# Patient Record
Sex: Male | Born: 1960 | Race: White | Hispanic: No | Smoking: Current every day smoker
Health system: Southern US, Community
[De-identification: ages and names within clinical notes are randomized; demographics above are authoritative.]

## PROBLEM LIST (undated history)

## (undated) DIAGNOSIS — F1011 Alcohol abuse, in remission: Secondary | ICD-10-CM

## (undated) HISTORY — PX: HUMERUS FRACTURE SURGERY: SHX670

---

## 2012-04-12 HISTORY — PX: ANKLE SURGERY: SHX546

## 2012-10-09 HISTORY — PX: ANKLE ARTHROPLASTY: SUR68

## 2017-07-10 ENCOUNTER — Encounter (HOSPITAL_COMMUNITY): Payer: Self-pay | Admitting: Radiology

## 2017-07-10 ENCOUNTER — Inpatient Hospital Stay (HOSPITAL_COMMUNITY): Payer: BLUE CROSS/BLUE SHIELD

## 2017-07-10 ENCOUNTER — Emergency Department (HOSPITAL_COMMUNITY): Payer: BLUE CROSS/BLUE SHIELD

## 2017-07-10 ENCOUNTER — Other Ambulatory Visit: Payer: Self-pay

## 2017-07-10 ENCOUNTER — Inpatient Hospital Stay (HOSPITAL_COMMUNITY)
Admission: EM | Admit: 2017-07-10 | Discharge: 2017-07-15 | DRG: 481 | Disposition: A | Discharge status: Rehabilitation Facility | Payer: BLUE CROSS/BLUE SHIELD | Attending: General Surgery | Admitting: General Surgery

## 2017-07-10 DIAGNOSIS — Z23 Encounter for immunization: Secondary | ICD-10-CM | POA: Diagnosis not present

## 2017-07-10 DIAGNOSIS — J939 Pneumothorax, unspecified: Secondary | ICD-10-CM | POA: Diagnosis not present

## 2017-07-10 DIAGNOSIS — S7221XA Displaced subtrochanteric fracture of right femur, initial encounter for closed fracture: Secondary | ICD-10-CM | POA: Diagnosis present

## 2017-07-10 DIAGNOSIS — S060X9A Concussion with loss of consciousness of unspecified duration, initial encounter: Secondary | ICD-10-CM | POA: Diagnosis present

## 2017-07-10 DIAGNOSIS — Z803 Family history of malignant neoplasm of breast: Secondary | ICD-10-CM | POA: Diagnosis not present

## 2017-07-10 DIAGNOSIS — R402412 Glasgow coma scale score 13-15, at arrival to emergency department: Secondary | ICD-10-CM | POA: Diagnosis present

## 2017-07-10 DIAGNOSIS — Z801 Family history of malignant neoplasm of trachea, bronchus and lung: Secondary | ICD-10-CM

## 2017-07-10 DIAGNOSIS — R609 Edema, unspecified: Secondary | ICD-10-CM | POA: Diagnosis not present

## 2017-07-10 DIAGNOSIS — S7221XD Displaced subtrochanteric fracture of right femur, subsequent encounter for closed fracture with routine healing: Secondary | ICD-10-CM | POA: Diagnosis not present

## 2017-07-10 DIAGNOSIS — Z8249 Family history of ischemic heart disease and other diseases of the circulatory system: Secondary | ICD-10-CM

## 2017-07-10 DIAGNOSIS — R739 Hyperglycemia, unspecified: Secondary | ICD-10-CM | POA: Diagnosis present

## 2017-07-10 DIAGNOSIS — D62 Acute posthemorrhagic anemia: Secondary | ICD-10-CM | POA: Diagnosis not present

## 2017-07-10 DIAGNOSIS — R0902 Hypoxemia: Secondary | ICD-10-CM | POA: Diagnosis not present

## 2017-07-10 DIAGNOSIS — S7290XA Unspecified fracture of unspecified femur, initial encounter for closed fracture: Secondary | ICD-10-CM

## 2017-07-10 DIAGNOSIS — S2221XA Fracture of manubrium, initial encounter for closed fracture: Secondary | ICD-10-CM | POA: Diagnosis present

## 2017-07-10 DIAGNOSIS — S20319A Abrasion of unspecified front wall of thorax, initial encounter: Secondary | ICD-10-CM | POA: Diagnosis present

## 2017-07-10 DIAGNOSIS — E669 Obesity, unspecified: Secondary | ICD-10-CM | POA: Diagnosis present

## 2017-07-10 DIAGNOSIS — Z8042 Family history of malignant neoplasm of prostate: Secondary | ICD-10-CM

## 2017-07-10 DIAGNOSIS — Y92410 Unspecified street and highway as the place of occurrence of the external cause: Secondary | ICD-10-CM

## 2017-07-10 DIAGNOSIS — Z419 Encounter for procedure for purposes other than remedying health state, unspecified: Secondary | ICD-10-CM

## 2017-07-10 DIAGNOSIS — S2241XA Multiple fractures of ribs, right side, initial encounter for closed fracture: Secondary | ICD-10-CM | POA: Diagnosis present

## 2017-07-10 DIAGNOSIS — M79641 Pain in right hand: Secondary | ICD-10-CM | POA: Diagnosis present

## 2017-07-10 DIAGNOSIS — S2241XS Multiple fractures of ribs, right side, sequela: Secondary | ICD-10-CM | POA: Diagnosis not present

## 2017-07-10 DIAGNOSIS — T07XXXA Unspecified multiple injuries, initial encounter: Secondary | ICD-10-CM | POA: Diagnosis not present

## 2017-07-10 DIAGNOSIS — S12500A Unspecified displaced fracture of sixth cervical vertebra, initial encounter for closed fracture: Secondary | ICD-10-CM | POA: Diagnosis present

## 2017-07-10 DIAGNOSIS — K5900 Constipation, unspecified: Secondary | ICD-10-CM | POA: Diagnosis not present

## 2017-07-10 DIAGNOSIS — F17210 Nicotine dependence, cigarettes, uncomplicated: Secondary | ICD-10-CM | POA: Diagnosis present

## 2017-07-10 DIAGNOSIS — S2249XA Multiple fractures of ribs, unspecified side, initial encounter for closed fracture: Secondary | ICD-10-CM

## 2017-07-10 DIAGNOSIS — M25561 Pain in right knee: Secondary | ICD-10-CM | POA: Diagnosis present

## 2017-07-10 DIAGNOSIS — T1490XA Injury, unspecified, initial encounter: Secondary | ICD-10-CM | POA: Diagnosis not present

## 2017-07-10 DIAGNOSIS — F418 Other specified anxiety disorders: Secondary | ICD-10-CM | POA: Diagnosis not present

## 2017-07-10 DIAGNOSIS — S2241XD Multiple fractures of ribs, right side, subsequent encounter for fracture with routine healing: Secondary | ICD-10-CM | POA: Diagnosis not present

## 2017-07-10 DIAGNOSIS — S7221XS Displaced subtrochanteric fracture of right femur, sequela: Secondary | ICD-10-CM | POA: Diagnosis not present

## 2017-07-10 DIAGNOSIS — Z6827 Body mass index (BMI) 27.0-27.9, adult: Secondary | ICD-10-CM | POA: Diagnosis not present

## 2017-07-10 DIAGNOSIS — S7291XA Unspecified fracture of right femur, initial encounter for closed fracture: Secondary | ICD-10-CM

## 2017-07-10 DIAGNOSIS — K5901 Slow transit constipation: Secondary | ICD-10-CM | POA: Diagnosis not present

## 2017-07-10 DIAGNOSIS — R339 Retention of urine, unspecified: Secondary | ICD-10-CM | POA: Diagnosis not present

## 2017-07-10 HISTORY — DX: Alcohol abuse, in remission: F10.11

## 2017-07-10 LAB — COMPREHENSIVE METABOLIC PANEL
ALBUMIN: 3.5 g/dL (ref 3.5–5.0)
ALT: 30 U/L (ref 17–63)
ANION GAP: 10 (ref 5–15)
AST: 45 U/L — ABNORMAL HIGH (ref 15–41)
Alkaline Phosphatase: 57 U/L (ref 38–126)
BUN: 15 mg/dL (ref 6–20)
CALCIUM: 8.5 mg/dL — AB (ref 8.9–10.3)
CHLORIDE: 106 mmol/L (ref 101–111)
CO2: 20 mmol/L — AB (ref 22–32)
Creatinine, Ser: 0.87 mg/dL (ref 0.61–1.24)
GFR calc non Af Amer: >60 mL/min (ref >60)
GLUCOSE: 151 mg/dL — AB (ref 65–99)
POTASSIUM: 4.3 mmol/L (ref 3.5–5.1)
SODIUM: 136 mmol/L (ref 135–145)
Total Bilirubin: 1.2 mg/dL (ref 0.3–1.2)
Total Protein: 6.6 g/dL (ref 6.5–8.1)

## 2017-07-10 LAB — CBC WITH DIFFERENTIAL/PLATELET
BASOS PCT: 0 %
Basophils Absolute: 0 10*3/uL (ref 0.0–0.1)
Eosinophils Absolute: 0.1 10*3/uL (ref 0.0–0.7)
Eosinophils Relative: 1 %
HEMATOCRIT: 44.5 % (ref 39.0–52.0)
HEMOGLOBIN: 15.5 g/dL (ref 13.0–17.0)
LYMPHS PCT: 18 %
Lymphs Abs: 1.9 10*3/uL (ref 0.7–4.0)
MCH: 31.1 pg (ref 26.0–34.0)
MCHC: 34.8 g/dL (ref 30.0–36.0)
MCV: 89.2 fL (ref 78.0–100.0)
MONO ABS: 0.7 10*3/uL (ref 0.1–1.0)
MONOS PCT: 7 %
NEUTROS ABS: 7.8 10*3/uL — AB (ref 1.7–7.7)
Neutrophils Relative %: 74 %
Platelets: 188 10*3/uL (ref 150–400)
RBC: 4.99 MIL/uL (ref 4.22–5.81)
RDW: 14 % (ref 11.5–15.5)
WBC: 10.6 10*3/uL — ABNORMAL HIGH (ref 4.0–10.5)

## 2017-07-10 LAB — I-STAT CHEM 8, ED
BUN: 17 mg/dL (ref 6–20)
CREATININE: 0.8 mg/dL (ref 0.61–1.24)
Calcium, Ion: 1.1 mmol/L — ABNORMAL LOW (ref 1.15–1.40)
Chloride: 105 mmol/L (ref 101–111)
Glucose, Bld: 149 mg/dL — ABNORMAL HIGH (ref 65–99)
HEMATOCRIT: 44 % (ref 39.0–52.0)
Hemoglobin: 15 g/dL (ref 13.0–17.0)
POTASSIUM: 4 mmol/L (ref 3.5–5.1)
Sodium: 140 mmol/L (ref 135–145)
TCO2: 23 mmol/L (ref 22–32)

## 2017-07-10 LAB — CDS SEROLOGY

## 2017-07-10 LAB — I-STAT TROPONIN, ED: Troponin i, poc: 0 ng/mL (ref 0.00–0.08)

## 2017-07-10 LAB — I-STAT CG4 LACTIC ACID, ED
LACTIC ACID, VENOUS: 1.3 mmol/L (ref 0.5–1.9)
Lactic Acid, Venous: 1.49 mmol/L (ref 0.5–1.9)

## 2017-07-10 LAB — TYPE AND SCREEN
ABO/RH(D): O NEG
Antibody Screen: NEGATIVE

## 2017-07-10 LAB — ETHANOL: Alcohol, Ethyl (B): <10 mg/dL (ref <10)

## 2017-07-10 LAB — ABO/RH: ABO/RH(D): O NEG

## 2017-07-10 LAB — SURGICAL PCR SCREEN
MRSA, PCR: NEGATIVE
Staphylococcus aureus: NEGATIVE

## 2017-07-10 LAB — PROTIME-INR
INR: 1.02
PROTHROMBIN TIME: 13.3 s (ref 11.4–15.2)

## 2017-07-10 MED ORDER — ONDANSETRON 4 MG PO TBDP
4.0000 mg | ORAL_TABLET | Freq: Four times a day (QID) | ORAL | Status: DC | PRN
  Filled 2017-07-10: qty 1

## 2017-07-10 MED ORDER — OXYCODONE HCL 5 MG PO TABS
10.0000 mg | ORAL_TABLET | ORAL | Status: DC | PRN
  Administered 2017-07-11 – 2017-07-15 (×15): 10 mg via ORAL
  Filled 2017-07-10 (×15): qty 2

## 2017-07-10 MED ORDER — HYDRALAZINE HCL 20 MG/ML IJ SOLN: 10.0000 mg | INTRAMUSCULAR | Status: DC | PRN

## 2017-07-10 MED ORDER — KCL IN DEXTROSE-NACL 20-5-0.45 MEQ/L-%-% IV SOLN
INTRAVENOUS | Status: DC
  Administered 2017-07-10 – 2017-07-13 (×4): via INTRAVENOUS
  Filled 2017-07-10 (×4): qty 1000

## 2017-07-10 MED ORDER — ONDANSETRON HCL 4 MG/2ML IJ SOLN
4.0000 mg | Freq: Once | INTRAMUSCULAR | Status: AC
  Administered 2017-07-10: 4 mg via INTRAVENOUS
  Filled 2017-07-10: qty 2

## 2017-07-10 MED ORDER — ONDANSETRON HCL 4 MG/2ML IJ SOLN: 4.0000 mg | Freq: Four times a day (QID) | INTRAMUSCULAR | Status: DC | PRN

## 2017-07-10 MED ORDER — MORPHINE SULFATE (PF) 4 MG/ML IV SOLN
4.0000 mg | Freq: Once | INTRAVENOUS | Status: AC
  Administered 2017-07-10: 4 mg via INTRAVENOUS
  Filled 2017-07-10: qty 1

## 2017-07-10 MED ORDER — ACETAMINOPHEN 325 MG PO TABS
650.0000 mg | ORAL_TABLET | ORAL | Status: DC | PRN
  Administered 2017-07-10 – 2017-07-12 (×2): 650 mg via ORAL
  Filled 2017-07-10 (×2): qty 2

## 2017-07-10 MED ORDER — OXYCODONE HCL 5 MG PO TABS: 5.0000 mg | ORAL_TABLET | ORAL | Status: DC | PRN

## 2017-07-10 MED ORDER — ORAL CARE MOUTH RINSE
15.0000 mL | Freq: Two times a day (BID) | OROMUCOSAL | Status: DC
  Administered 2017-07-10 – 2017-07-14 (×6): 15 mL via OROMUCOSAL

## 2017-07-10 MED ORDER — DOCUSATE SODIUM 100 MG PO CAPS
100.0000 mg | ORAL_CAPSULE | Freq: Two times a day (BID) | ORAL | Status: DC
  Administered 2017-07-10 – 2017-07-15 (×9): 100 mg via ORAL
  Filled 2017-07-10 (×9): qty 1

## 2017-07-10 MED ORDER — HYDROMORPHONE HCL 1 MG/ML IJ SOLN
0.5000 mg | INTRAMUSCULAR | Status: DC | PRN
  Administered 2017-07-10: 2 mg via INTRAVENOUS
  Administered 2017-07-11 – 2017-07-13 (×6): 1 mg via INTRAVENOUS
  Administered 2017-07-14: 2 mg via INTRAVENOUS
  Administered 2017-07-14: 1 mg via INTRAVENOUS
  Filled 2017-07-10: qty 1
  Filled 2017-07-10: qty 2
  Filled 2017-07-10 (×3): qty 1
  Filled 2017-07-10: qty 2
  Filled 2017-07-10 (×3): qty 1

## 2017-07-10 MED ORDER — TETANUS-DIPHTH-ACELL PERTUSSIS 5-2.5-18.5 LF-MCG/0.5 IM SUSP
0.5000 mL | Freq: Once | INTRAMUSCULAR | Status: AC
  Administered 2017-07-10: 0.5 mL via INTRAMUSCULAR
  Filled 2017-07-10: qty 0.5

## 2017-07-10 MED ORDER — DIPHENHYDRAMINE HCL 50 MG/ML IJ SOLN: 12.5000 mg | Freq: Four times a day (QID) | INTRAMUSCULAR | Status: DC | PRN

## 2017-07-10 MED ORDER — ENOXAPARIN SODIUM 40 MG/0.4ML ~~LOC~~ SOLN
40.0000 mg | SUBCUTANEOUS | Status: DC
  Administered 2017-07-10 – 2017-07-14 (×4): 40 mg via SUBCUTANEOUS
  Filled 2017-07-10 (×4): qty 0.4

## 2017-07-10 MED ORDER — MIDAZOLAM HCL 2 MG/2ML IJ SOLN
2.0000 mg | Freq: Once | INTRAMUSCULAR | Status: AC
  Administered 2017-07-10: 2 mg via INTRAVENOUS
  Filled 2017-07-10: qty 2

## 2017-07-10 MED ORDER — IOPAMIDOL (ISOVUE-300) INJECTION 61%
INTRAVENOUS | Status: AC
  Administered 2017-07-10: 100 mL
  Filled 2017-07-10: qty 100

## 2017-07-10 NOTE — Progress Notes (Signed)
Orthopedic Tech Progress Note Patient Details:  Bradley Pearson 1960/05/02 [id number]  Patient ID: Bradley ModestGordon Bradley Pearson, male   DOB: 1960/05/02, 57 y.o.   MRN: [id number]   Nikki DomCrawford, Bradley Pearson 07/10/2017, 1:58 PM Made level 2 trauma visit
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
## 2017-07-10 NOTE — ED Notes (Signed)
ED Provider at bedside. 

## 2017-07-10 NOTE — Progress Notes (Signed)
Orthopedic Tech Progress Note Patient Details:  Bradley Pearson [birth year] [id number]  Musculoskeletal Traction Type of Traction: Skeletal (Balanced Suspension) Traction Location: RLE Traction Weight: 20 lbs   Post Interventions Patient Tolerated: Well Instructions Provided: Care of device   Jennye MoccasinHughes, Caylea Foronda [name] 07/10/2017, 5:08 PM
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
## 2017-07-10 NOTE — ED Notes (Signed)
Orthopedic at bedside for traction
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
## 2017-07-10 NOTE — ED Notes (Signed)
Patient transported to CT 
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
## 2017-07-11 ENCOUNTER — Encounter (HOSPITAL_COMMUNITY): Payer: Self-pay | Admitting: Anesthesiology

## 2017-07-11 ENCOUNTER — Inpatient Hospital Stay (HOSPITAL_COMMUNITY): Payer: BLUE CROSS/BLUE SHIELD

## 2017-07-11 ENCOUNTER — Inpatient Hospital Stay (HOSPITAL_COMMUNITY): Payer: BLUE CROSS/BLUE SHIELD | Admitting: Anesthesiology

## 2017-07-11 ENCOUNTER — Encounter (HOSPITAL_COMMUNITY): Admission: EM | Disposition: A | Discharge status: Rehabilitation Facility | Payer: Self-pay | Source: Home / Self Care

## 2017-07-11 HISTORY — PX: FEMUR IM NAIL: SHX1597

## 2017-07-11 LAB — BASIC METABOLIC PANEL
ANION GAP: 9 (ref 5–15)
BUN: 12 mg/dL (ref 6–20)
CALCIUM: 8.3 mg/dL — AB (ref 8.9–10.3)
CO2: 22 mmol/L (ref 22–32)
Chloride: 105 mmol/L (ref 101–111)
Creatinine, Ser: 0.78 mg/dL (ref 0.61–1.24)
Glucose, Bld: 139 mg/dL — ABNORMAL HIGH (ref 65–99)
Potassium: 4.1 mmol/L (ref 3.5–5.1)
SODIUM: 136 mmol/L (ref 135–145)

## 2017-07-11 LAB — URINALYSIS, ROUTINE W REFLEX MICROSCOPIC
Bilirubin Urine: NEGATIVE
GLUCOSE, UA: NEGATIVE mg/dL
Ketones, ur: 20 mg/dL — AB
Leukocytes, UA: NEGATIVE
Nitrite: NEGATIVE
PH: 5 (ref 5.0–8.0)
Protein, ur: NEGATIVE mg/dL
Squamous Epithelial / LPF: NONE SEEN

## 2017-07-11 LAB — CBC
HCT: 42.1 % (ref 39.0–52.0)
Hemoglobin: 14.7 g/dL (ref 13.0–17.0)
MCH: 31.7 pg (ref 26.0–34.0)
MCHC: 34.9 g/dL (ref 30.0–36.0)
MCV: 90.7 fL (ref 78.0–100.0)
PLATELETS: 189 10*3/uL (ref 150–400)
RBC: 4.64 MIL/uL (ref 4.22–5.81)
RDW: 14.6 % (ref 11.5–15.5)
WBC: 8.1 10*3/uL (ref 4.0–10.5)

## 2017-07-11 LAB — HIV ANTIBODY (ROUTINE TESTING W REFLEX): HIV SCREEN 4TH GENERATION: NONREACTIVE

## 2017-07-11 SURGERY — INSERTION, INTRAMEDULLARY ROD, FEMUR
Anesthesia: General | Laterality: Right

## 2017-07-11 MED ORDER — MEPERIDINE HCL 50 MG/ML IJ SOLN: 6.2500 mg | INTRAMUSCULAR | Status: DC | PRN

## 2017-07-11 MED ORDER — ROCURONIUM BROMIDE 100 MG/10ML IV SOLN
INTRAVENOUS | Status: DC | PRN
  Administered 2017-07-11: 10 mg via INTRAVENOUS
  Administered 2017-07-11: 20 mg via INTRAVENOUS
  Administered 2017-07-11: 10 mg via INTRAVENOUS
  Administered 2017-07-11: 50 mg via INTRAVENOUS

## 2017-07-11 MED ORDER — FENTANYL CITRATE (PF) 250 MCG/5ML IJ SOLN
INTRAMUSCULAR | Status: AC
  Filled 2017-07-11: qty 5

## 2017-07-11 MED ORDER — HYDROMORPHONE HCL 1 MG/ML IJ SOLN
INTRAMUSCULAR | Status: AC
  Filled 2017-07-11: qty 1

## 2017-07-11 MED ORDER — CEFAZOLIN SODIUM-DEXTROSE 2-4 GM/100ML-% IV SOLN
2.0000 g | Freq: Three times a day (TID) | INTRAVENOUS | Status: AC
  Administered 2017-07-11 – 2017-07-12 (×3): 2 g via INTRAVENOUS
  Filled 2017-07-11 (×3): qty 100

## 2017-07-11 MED ORDER — ONDANSETRON HCL 4 MG/2ML IJ SOLN
INTRAMUSCULAR | Status: DC | PRN
  Administered 2017-07-11: 4 mg via INTRAVENOUS

## 2017-07-11 MED ORDER — EPHEDRINE 5 MG/ML INJ
INTRAVENOUS | Status: AC
  Filled 2017-07-11: qty 10

## 2017-07-11 MED ORDER — CEFAZOLIN SODIUM-DEXTROSE 2-4 GM/100ML-% IV SOLN
2.0000 g | INTRAVENOUS | Status: AC
  Administered 2017-07-11: 2 g via INTRAVENOUS
  Filled 2017-07-11: qty 100

## 2017-07-11 MED ORDER — DEXAMETHASONE SODIUM PHOSPHATE 10 MG/ML IJ SOLN
INTRAMUSCULAR | Status: AC
  Filled 2017-07-11: qty 1

## 2017-07-11 MED ORDER — FENTANYL CITRATE (PF) 100 MCG/2ML IJ SOLN
INTRAMUSCULAR | Status: AC
  Filled 2017-07-11: qty 2

## 2017-07-11 MED ORDER — CHLORHEXIDINE GLUCONATE 4 % EX LIQD: 60.0000 mL | Freq: Once | CUTANEOUS | Status: DC

## 2017-07-11 MED ORDER — ONDANSETRON HCL 4 MG/2ML IJ SOLN: 4.0000 mg | Freq: Once | INTRAMUSCULAR | Status: DC | PRN

## 2017-07-11 MED ORDER — VANCOMYCIN HCL 1000 MG IV SOLR
INTRAVENOUS | Status: AC
  Filled 2017-07-11: qty 1000

## 2017-07-11 MED ORDER — SUGAMMADEX SODIUM 200 MG/2ML IV SOLN
INTRAVENOUS | Status: DC | PRN
  Administered 2017-07-11: 200 mg via INTRAVENOUS

## 2017-07-11 MED ORDER — VANCOMYCIN HCL 1000 MG IV SOLR
INTRAVENOUS | Status: DC | PRN
  Administered 2017-07-11: 1000 mg via TOPICAL

## 2017-07-11 MED ORDER — PHENYLEPHRINE 40 MCG/ML (10ML) SYRINGE FOR IV PUSH (FOR BLOOD PRESSURE SUPPORT)
PREFILLED_SYRINGE | INTRAVENOUS | Status: AC
  Filled 2017-07-11: qty 40

## 2017-07-11 MED ORDER — MIDAZOLAM HCL 2 MG/2ML IJ SOLN
INTRAMUSCULAR | Status: DC | PRN
  Administered 2017-07-11: 2 mg via INTRAVENOUS

## 2017-07-11 MED ORDER — INFLUENZA VAC SPLIT QUAD 0.5 ML IM SUSY
0.5000 mL | PREFILLED_SYRINGE | INTRAMUSCULAR | Status: AC
  Administered 2017-07-12: 0.5 mL via INTRAMUSCULAR
  Filled 2017-07-11: qty 0.5

## 2017-07-11 MED ORDER — ONDANSETRON HCL 4 MG/2ML IJ SOLN
INTRAMUSCULAR | Status: AC
  Filled 2017-07-11: qty 2

## 2017-07-11 MED ORDER — PROPOFOL 10 MG/ML IV BOLUS
INTRAVENOUS | Status: AC
Start: 2017-07-11 — End: 2017-07-11
  Filled 2017-07-11: qty 20

## 2017-07-11 MED ORDER — HYDROMORPHONE HCL 1 MG/ML IJ SOLN
0.2500 mg | INTRAMUSCULAR | Status: DC | PRN
  Administered 2017-07-11 (×2): 0.5 mg via INTRAVENOUS
  Administered 2017-07-11 (×2): 0.25 mg via INTRAVENOUS

## 2017-07-11 MED ORDER — LIDOCAINE HCL (CARDIAC) 20 MG/ML IV SOLN
INTRAVENOUS | Status: AC
  Filled 2017-07-11: qty 5

## 2017-07-11 MED ORDER — PHENYLEPHRINE 40 MCG/ML (10ML) SYRINGE FOR IV PUSH (FOR BLOOD PRESSURE SUPPORT)
PREFILLED_SYRINGE | INTRAVENOUS | Status: DC | PRN
  Administered 2017-07-11 (×2): 80 ug via INTRAVENOUS

## 2017-07-11 MED ORDER — LACTATED RINGERS IV SOLN
INTRAVENOUS | Status: DC
  Administered 2017-07-11 (×2): via INTRAVENOUS

## 2017-07-11 MED ORDER — DEXAMETHASONE SODIUM PHOSPHATE 10 MG/ML IJ SOLN
INTRAMUSCULAR | Status: DC | PRN
  Administered 2017-07-11: 10 mg via INTRAVENOUS

## 2017-07-11 MED ORDER — PROPOFOL 10 MG/ML IV BOLUS
INTRAVENOUS | Status: DC | PRN
  Administered 2017-07-11: 180 mg via INTRAVENOUS

## 2017-07-11 MED ORDER — LIDOCAINE 2% (20 MG/ML) 5 ML SYRINGE
INTRAMUSCULAR | Status: DC | PRN
  Administered 2017-07-11: 60 mg via INTRAVENOUS

## 2017-07-11 MED ORDER — FENTANYL CITRATE (PF) 100 MCG/2ML IJ SOLN
INTRAMUSCULAR | Status: DC | PRN
  Administered 2017-07-11 (×2): 50 ug via INTRAVENOUS
  Administered 2017-07-11: 100 ug via INTRAVENOUS
  Administered 2017-07-11: 50 ug via INTRAVENOUS

## 2017-07-11 MED ORDER — 0.9 % SODIUM CHLORIDE (POUR BTL) OPTIME
TOPICAL | Status: DC | PRN
  Administered 2017-07-11: 1000 mL

## 2017-07-11 MED ORDER — EPHEDRINE SULFATE-NACL 50-0.9 MG/10ML-% IV SOSY
PREFILLED_SYRINGE | INTRAVENOUS | Status: DC | PRN
  Administered 2017-07-11: 10 mg via INTRAVENOUS

## 2017-07-11 MED ORDER — ROCURONIUM BROMIDE 10 MG/ML (PF) SYRINGE
PREFILLED_SYRINGE | INTRAVENOUS | Status: AC
  Filled 2017-07-11: qty 15

## 2017-07-11 MED ORDER — MIDAZOLAM HCL 2 MG/2ML IJ SOLN
INTRAMUSCULAR | Status: AC
  Filled 2017-07-11: qty 2

## 2017-07-11 MED ORDER — POVIDONE-IODINE 10 % EX SWAB: 2.0000 "application " | Freq: Once | CUTANEOUS | Status: DC

## 2017-07-11 MED ORDER — SUGAMMADEX SODIUM 200 MG/2ML IV SOLN
INTRAVENOUS | Status: AC
  Filled 2017-07-11: qty 2

## 2017-07-11 SURGICAL SUPPLY — 54 items
BANDAGE ACE 4X5 VEL STRL LF (GAUZE/BANDAGES/DRESSINGS) ×2 IMPLANT
BANDAGE ACE 6X5 VEL STRL LF (GAUZE/BANDAGES/DRESSINGS) IMPLANT
BANDAGE ELASTIC 6 VELCRO ST LF (GAUZE/BANDAGES/DRESSINGS) ×2 IMPLANT
BIT DRILL SHORT 4.2 (BIT) IMPLANT
BLADE SURG 10 STRL SS (BLADE) ×3 IMPLANT
BNDG COHESIVE 4X5 TAN STRL (GAUZE/BANDAGES/DRESSINGS) ×2 IMPLANT
BNDG COHESIVE 6X5 TAN STRL LF (GAUZE/BANDAGES/DRESSINGS) ×2 IMPLANT
BRUSH SCRUB SURG 4.25 DISP (MISCELLANEOUS) ×4 IMPLANT
CHLORAPREP W/TINT 26ML (MISCELLANEOUS) ×3 IMPLANT
COVER SURGICAL LIGHT HANDLE (MISCELLANEOUS) ×2 IMPLANT
DRAPE C-ARM 35X43 STRL (DRAPES) ×2 IMPLANT
DRAPE C-ARMOR (DRAPES) ×2 IMPLANT
DRAPE HALF SHEET 40X57 (DRAPES) ×4 IMPLANT
DRAPE IMP U-DRAPE 54X76 (DRAPES) ×4 IMPLANT
DRAPE INCISE IOBAN 66X45 STRL (DRAPES) ×2 IMPLANT
DRAPE ORTHO SPLIT 77X108 STRL (DRAPES) ×3
DRAPE SURG 17X23 STRL (DRAPES) ×2 IMPLANT
DRAPE SURG ORHT 6 SPLT 77X108 (DRAPES) ×2 IMPLANT
DRAPE U-SHAPE 47X51 STRL (DRAPES) ×2 IMPLANT
DRILL BIT SHORT 4.2 (BIT) ×2
DRSG MEPILEX BORDER 4X4 (GAUZE/BANDAGES/DRESSINGS) ×4 IMPLANT
DRSG MEPILEX BORDER 4X8 (GAUZE/BANDAGES/DRESSINGS) ×2 IMPLANT
ELECT REM PT RETURN 9FT ADLT (ELECTROSURGICAL) ×2
ELECTRODE REM PT RTRN 9FT ADLT (ELECTROSURGICAL) ×1 IMPLANT
GLOVE BIO SURGEON STRL SZ7.5 (GLOVE) ×6 IMPLANT
GLOVE BIOGEL PI IND STRL 7.5 (GLOVE) ×1 IMPLANT
GLOVE BIOGEL PI INDICATOR 7.5 (GLOVE) ×2
GOWN STRL REUS W/ TWL LRG LVL3 (GOWN DISPOSABLE) ×3 IMPLANT
GOWN STRL REUS W/TWL LRG LVL3 (GOWN DISPOSABLE) ×3
GOWN STRL REUS W/TWL XL LVL3 (GOWN DISPOSABLE) ×4 IMPLANT
GUIDEWIRE 3.2X400 (WIRE) ×3 IMPLANT
KIT BASIN OR (CUSTOM PROCEDURE TRAY) ×2 IMPLANT
KIT TURNOVER KIT B (KITS) ×2 IMPLANT
MANIFOLD NEPTUNE II (INSTRUMENTS) ×2 IMPLANT
NAIL CANN FRN 10MM RIGHT (Nail) ×1 IMPLANT
NS IRRIG 1000ML POUR BTL (IV SOLUTION) ×2 IMPLANT
PACK GENERAL/GYN (CUSTOM PROCEDURE TRAY) ×2 IMPLANT
PAD ARMBOARD 7.5X6 YLW CONV (MISCELLANEOUS) ×4 IMPLANT
ROD REAMER STRT BALL 3.0X950 (MISCELLANEOUS) ×1 IMPLANT
SCREW 6.5MM W/T25 STARDRIVE (Screw) ×1 IMPLANT
SCREW LOCKING 5.0X38MM (Screw) ×2 IMPLANT
SCREW RECON W/T25 STRD 90MM (Screw) ×1 IMPLANT
STAPLER VISISTAT 35W (STAPLE) ×2 IMPLANT
STOCKINETTE IMPERVIOUS LG (DRAPES) ×2 IMPLANT
SUT ETHILON 3 0 PS 1 (SUTURE) ×4 IMPLANT
SUT MNCRL AB 3-0 PS2 18 (SUTURE) ×1 IMPLANT
SUT VIC AB 0 CT1 27 (SUTURE) ×3
SUT VIC AB 0 CT1 27XBRD ANBCTR (SUTURE) IMPLANT
SUT VIC AB 2-0 CT1 27 (SUTURE) ×1
SUT VIC AB 2-0 CT1 TAPERPNT 27 (SUTURE) ×2 IMPLANT
TOWEL OR 17X24 6PK STRL BLUE (TOWEL DISPOSABLE) ×2 IMPLANT
TOWEL OR 17X26 10 PK STRL BLUE (TOWEL DISPOSABLE) ×4 IMPLANT
UNDERPAD 30X30 (UNDERPADS AND DIAPERS) ×2 IMPLANT
WATER STERILE IRR 1000ML POUR (IV SOLUTION) ×1 IMPLANT
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
## 2017-07-11 NOTE — Anesthesia Postprocedure Evaluation (Signed)
Anesthesia Post Note  Patient: Bradley Pearson  Procedure(s) Performed: INTRAMEDULLARY (IM) NAIL FEMORAL (Right )     Patient location during evaluation: PACU Anesthesia Type: General Level of consciousness: sedated Pain management: pain level controlled Vital Signs Assessment: post-procedure vital signs reviewed and stable Respiratory status: spontaneous breathing and respiratory function stable Cardiovascular status: stable Postop Assessment: no apparent nausea or vomiting Anesthetic complications: no    Last Vitals:  Vitals:   07/11/17 1626 07/11/17 1636  BP:  124/72  Pulse: 76 73  Resp: 12 14  Temp:    SpO2: 98% 96%    Last Pain:  Vitals:   07/11/17 1636  TempSrc:   PainSc: Asleep                 Hesston Hitchens DANIEL

## 2017-07-11 NOTE — Anesthesia Procedure Notes (Signed)
Procedure Name: Intubation Date/Time: 07/11/2017 1:30 PM Performed by: Jodell Ciproato, Juandavid Dallman A, CRNA Pre-anesthesia Checklist: Patient identified, Emergency Drugs available, Suction available and Patient being monitored Patient Re-evaluated:Patient Re-evaluated prior to induction Oxygen Delivery Method: Circle System Utilized Preoxygenation: Pre-oxygenation with 100% oxygen Induction Type: IV induction Ventilation: Mask ventilation without difficulty and Oral airway inserted - appropriate to patient size Laryngoscope Size: Glidescope and 4 Grade View: Grade I Tube type: Oral Tube size: 8.0 mm Number of attempts: 1 Airway Equipment and Method: Stylet and Oral airway Placement Confirmation: ETT inserted through vocal cords under direct vision,  positive ETCO2 and breath sounds checked- equal and bilateral Secured at: 22 cm Tube secured with: Tape Dental Injury: Teeth and Oropharynx as per pre-operative assessment

## 2017-07-11 NOTE — Transfer of Care (Addendum)
Immediate Anesthesia Transfer of Care Note  Patient: Bradley Pearson  Procedure(s) Performed: INTRAMEDULLARY (IM) NAIL FEMORAL (Right )  Patient Location: PACU  Anesthesia Type:General  Level of Consciousness: awake, alert  and oriented  Airway & Oxygen Therapy: Patient Spontanous Breathing and Patient connected to nasal cannula oxygen  Post-op Assessment: Report given to RN, Post -op Vital signs reviewed and stable and Patient moving all extremities  Post vital signs: Reviewed and stable  Last Vitals:  Vitals Value Taken Time  BP 148/83 07/11/2017  3:35 PM  Temp    Pulse 86 07/11/2017  3:41 PM  Resp 15 07/11/2017  3:41 PM  SpO2 94 % 07/11/2017  3:41 PM  Vitals shown include unvalidated device data.  Last Pain:  Vitals:   07/11/17 1100  TempSrc:   PainSc: 8       Patients Stated Pain Goal: 2 (07/10/17 2006)  Complications: No apparent anesthesia complications
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
## 2017-07-11 NOTE — Interval H&P Note (Signed)
History and Physical Interval Note:  07/11/2017 12:21 PM  Bradley Pearson  has presented today for surgery, with the diagnosis of Right femur fracture  The various methods of treatment have been discussed with the patient and family. After consideration of risks, benefits and other options for treatment, the patient has consented to  Procedure(s): INTRAMEDULLARY (IM) NAIL FEMORAL (Right) as a surgical intervention .  The patient's history has been reviewed, patient examined, no change in status, stable for surgery.  I have reviewed the patient's chart and labs.  Questions were answered to the patient's satisfaction.     Bradley Pearson

## 2017-07-12 ENCOUNTER — Encounter (HOSPITAL_COMMUNITY): Payer: Self-pay | Admitting: General Practice

## 2017-07-12 LAB — CBC
HEMATOCRIT: 37.7 % — AB (ref 39.0–52.0)
HEMOGLOBIN: 12.2 g/dL — AB (ref 13.0–17.0)
MCH: 29.7 pg (ref 26.0–34.0)
MCHC: 32.4 g/dL (ref 30.0–36.0)
MCV: 91.7 fL (ref 78.0–100.0)
Platelets: 170 10*3/uL (ref 150–400)
RBC: 4.11 MIL/uL — ABNORMAL LOW (ref 4.22–5.81)
RDW: 14.3 % (ref 11.5–15.5)
WBC: 9.8 10*3/uL (ref 4.0–10.5)
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
## 2017-07-12 NOTE — Progress Notes (Signed)
Rehab Admissions Coordinator Note:  Patient was screened by Clois DupesBoyette, Ferdinand Revoir Godwin for appropriateness for an Inpatient Acute Rehab Consult per PT recommendation. .  At this time, we are recommending Inpatient Rehab consult. Please place order for consult.  Clois DupesBoyette, Sheray Grist Godwin 07/12/2017, 3:29 PM  I can be reached at [phone].

## 2017-07-12 NOTE — Progress Notes (Signed)
Orthopaedic Trauma Progress Note  S: Doing well, complaining of right knee soreness and muscle soreness in thigh  O:  Vitals:   07/12/17 0003 07/12/17 0441  BP: 138/86 126/87  Pulse: 89 83  Resp: 18 18  Temp: 99.1 F (37.3 C) 98.6 F (37 C)  SpO2: 98% 98%  Gen: NAD, AAOx3 WUJ:WJXBJRLE:Thigh swollen but compressible and soft. Motor and sensory intact. 2+DP pulse, mild swelling about knee  Imaging: X-rays of femur show stable fixation with good alignment  Labs:  CBC    Component Value Date/Time   WBC 9.8 07/12/2017 0507   RBC 4.11 (L) 07/12/2017 0507   HGB 12.2 (L) 07/12/2017 0507   HCT 37.7 (L) 07/12/2017 0507   PLT 170 07/12/2017 0507   MCV 91.7 07/12/2017 0507   MCH 29.7 07/12/2017 0507   MCHC 32.4 07/12/2017 0507   RDW 14.3 07/12/2017 0507   LYMPHSABS 1.9 07/10/2017 1425   MONOABS 0.7 07/10/2017 1425   EOSABS 0.1 07/10/2017 1425   BASOSABS 0.0 07/10/2017 1425    A/P: 57 yo male s/p IMN right femur  Plan to do PT today Weightbearing: TDWB RLE Insicional and dressing care: OK to remove dressings postoperative day 2 or 3 and leave open to air with dry gauze PRN Orthopedic device(s): None Showering: Okay to shower once dressings are removed VTE prophylaxis: Lovenox 40mg  qd 30 days Pain control: Per trauma team Follow - up plan: 2 weeks  Roby LoftsKevin P. Chimere Klingensmith, MD [facility] [phone] (phone)

## 2017-07-12 NOTE — Progress Notes (Signed)
LOS: 2 days   Subjective:    [name] is a 57yo M POD#1 following intramedullary nailing of right femur fracture. He is resting comfortably in bed. He states he has pain with respiration, swollowing is difficult, and hesitancy with coughing due to pain. He reports pain in his knee cartilage with internal and external rotation of the RLE. He states his pain from the surgery is minimal and well controled.  He states he has not moved from the bed, he is passing flatus, has not had a BM, is tolerating solid food, has a foley in place.  He is worried about discharge, although he has a support system of 3 siblings living in the area, he doesn't believe any of the sibling homes are mobility accessible. Patient does not feel ready for discharge today.   Objective: Vital signs in last 24 hours: Temp:  [98.1 F (36.7 C)-99.1 F (37.3 C)] 98.6 F (37 C) (04/02 0441) Pulse Rate:  [66-95] 83 (04/02 0441) Resp:  [9-20] 18 (04/02 0441) BP: (112-148)/(71-95) 126/87 (04/02 0441) SpO2:  [94 %-98 %] 98 % (04/02 0441) Weight:  [117 kg (258 lb)] 117 kg (258 lb) (04/01 1228) Last BM Date: 07/10/17   Laboratory  CBC Recent Labs    07/11/17 0321 07/12/17 0507  WBC 8.1 9.8  HGB 14.7 12.2*  HCT 42.1 37.7*  PLT 189 170   BMET Recent Labs    07/10/17 1425 07/10/17 1436 07/11/17 0321  NA 136 140 136  K 4.3 4.0 4.1  CL 106 105 105  CO2 20*  --  22  GLUCOSE 151* 149* 139*  BUN 15 17 12   CREATININE 0.87 0.80 0.78  CALCIUM 8.5*  --  8.3*     Physical Exam General appearance: alert and cooperative Resp: symetric chest wall movement  Cardio: regular rate and rhythm GI: soft, non-tender; bowel sounds normal; no masses,  no organomegaly Pulses: 2+ and symmetric Neurologic: Grossly normal Incision/Wound: bandage CDI, ice pack over right knee    Assessment/Plan:  Mr Bradley Pearson is a 57yo M POD#1 following right femur repair. - meeting postoperative milestones  - continue adequate pain  control - encourage IS - PT/OT consult - fill and full of foley catheter - consider brace for right knee to limit internal and external rotation  General Trauma PA pager [phone]  07/12/2017
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
## 2017-07-13 DIAGNOSIS — S7221XA Displaced subtrochanteric fracture of right femur, initial encounter for closed fracture: Principal | ICD-10-CM

## 2017-07-13 DIAGNOSIS — S2241XA Multiple fractures of ribs, right side, initial encounter for closed fracture: Secondary | ICD-10-CM

## 2017-07-13 LAB — GLUCOSE, CAPILLARY
GLUCOSE-CAPILLARY: 94 mg/dL (ref 65–99)
Glucose-Capillary: 76 mg/dL (ref 65–99)

## 2017-07-13 MED ORDER — ACETAMINOPHEN 325 MG PO TABS
650.0000 mg | ORAL_TABLET | Freq: Four times a day (QID) | ORAL | Status: DC
  Administered 2017-07-13 – 2017-07-15 (×8): 650 mg via ORAL
  Filled 2017-07-13 (×8): qty 2

## 2017-07-13 MED ORDER — TAMSULOSIN HCL 0.4 MG PO CAPS
0.4000 mg | ORAL_CAPSULE | Freq: Every day | ORAL | Status: DC
  Administered 2017-07-13 – 2017-07-15 (×3): 0.4 mg via ORAL
  Filled 2017-07-13 (×3): qty 1

## 2017-07-13 MED ORDER — POLYETHYLENE GLYCOL 3350 17 G PO PACK
17.0000 g | PACK | Freq: Every day | ORAL | Status: DC
  Administered 2017-07-13 – 2017-07-15 (×3): 17 g via ORAL
  Filled 2017-07-13 (×3): qty 1

## 2017-07-13 NOTE — Progress Notes (Signed)
Pt was complaining about the 10/10  pain caused by the cervical collar despite pain medication. MD was paged to see if it can be removed for a short period of time. Per MD it should be on all the time. Pt was educated.

## 2017-07-13 NOTE — Plan of Care (Signed)
  Problem: Pain Managment: Goal: General experience of comfort will improve Outcome: Progressing   

## 2017-07-13 NOTE — Progress Notes (Signed)
I have begun insurance approval with [employer] [place] for a possible inpt rehab admit pending insurance approval and bed availability. I will follow up tomorrow. [phone]

## 2017-07-13 NOTE — Progress Notes (Signed)
Pt's foley removed per order, still unable to void on own, MD paged and verbal orders given to reinsert foley and start flomax.

## 2017-07-13 NOTE — Progress Notes (Signed)
LOS: 3 days   Subjective: [name] is a 57yo M POD#2 following intramedullary mailing of right femur fracture  Today he reports he doing better than yesterday, he complains of grinding pain in his right knee, but it is less painful than yesterday. He also reports weakness to his entire right lower extremitiy. His vista C- collar is pressing into his chest and is bothering him.      Objective: Vital signs in last 24 hours: Temp:  [98.6 F (37 C)-98.7 F (37.1 C)] 98.6 F (37 C) (04/03 0544) Pulse Rate:  [85-92] 85 (04/03 0544) Resp:  [18] 18 (04/03 0544) BP: (125-133)/(75-84) 133/75 (04/03 0544) SpO2:  [92 %-98 %] 98 % (04/03 0544) Last BM Date: 07/10/17   Laboratory  CBC Recent Labs    07/11/17 0321 07/12/17 0507  WBC 8.1 9.8  HGB 14.7 12.2*  HCT 42.1 37.7*  PLT 189 170   BMET Recent Labs    07/10/17 1425 07/10/17 1436 07/11/17 0321  NA 136 140 136  K 4.3 4.0 4.1  CL 106 105 105  CO2 20*  --  22  GLUCOSE 151* 149* 139*  BUN 15 17 12   CREATININE 0.87 0.80 0.78  CALCIUM 8.5*  --  8.3*     Physical Exam General appearance: alert and cooperative Resp: clear to auscultation bilaterally Chest wall: no tenderness, right sided chest wall tenderness, left sided chest wall tenderness Cardio: regular rate and rhythm GI: soft, non-tender; bowel sounds normal; no masses,  no organomegaly Extremities: bandages in place to right lower extremity consistent with internal fixation to right femur   Assessment/Plan:  Right femur fracture: - POD#2  - PT following - Follow ortho recommendations: TDWB, remove dressing today, leave open to air, use dry gauze PRN, OK to shower - Continue to manage pain - f/u with Dr. Jena GaussHaddix in 2 weeks   Chest pain due to cervical collar: - refit for doughnut C collar  Right Knee pain: - 3 view x ray  Constipation: - continue docusate, add mirlax BID  Dispo: discharge to inpatient rehab today.    Willeen CassCaroline Faduma Cho General  Trauma PA pager [phone]  07/13/2017
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
## 2017-07-14 LAB — GLUCOSE, CAPILLARY
GLUCOSE-CAPILLARY: 96 mg/dL (ref 65–99)
GLUCOSE-CAPILLARY: 105 mg/dL — AB (ref 65–99)

## 2017-07-14 MED ORDER — MAGNESIUM HYDROXIDE 400 MG/5ML PO SUSP
30.0000 mL | Freq: Once | ORAL | Status: AC
  Administered 2017-07-14: 30 mL via ORAL
  Filled 2017-07-14: qty 30

## 2017-07-14 MED ORDER — BISACODYL 10 MG RE SUPP
10.0000 mg | Freq: Once | RECTAL | Status: AC
  Administered 2017-07-14: 10 mg via RECTAL
  Filled 2017-07-14: qty 1

## 2017-07-14 MED ORDER — BETHANECHOL CHLORIDE 25 MG PO TABS
25.0000 mg | ORAL_TABLET | Freq: Three times a day (TID) | ORAL | Status: DC
  Administered 2017-07-14 – 2017-07-15 (×4): 25 mg via ORAL
  Filled 2017-07-14 (×4): qty 1
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
## 2017-07-14 NOTE — Progress Notes (Signed)
Patient contacted me to say that if [facility] not available [facility], he would prefer [place] [facility]. Also [facility] and await bed availability, which I state he could not for I could not guarantee a bed like that. I have updated RN CM. [phone]

## 2017-07-14 NOTE — Progress Notes (Signed)
I have spoken with Tiburcio PeaPam Byers, admissions coordinator for [facility], and they currently have bed availability.  Faxed pt's demographic and clinical information to admissions coordinator, who will review with facility MD in AM.  Facility does not admit patients on weekends.  Admissions coordinator to follow up with me in AM.    Quintella BatonJulie W. Elya Tarquinio, RN, BSN  Trauma/Neuro ICU Case Manager [phone]
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
## 2017-07-14 NOTE — Progress Notes (Signed)
I met with patient at bedside to discuss his options for rehab. Currently I await [place] decision on approval, but I do not have an inpt rehab bed to offer today for him. I discussed other inpt rehab facilities vs SNF rehab for felt pt likely ready to d/c per Trauma notes. He states he will call his brother after lunch for he is assisting him in these decisions to request he contact me. I have alerted RN CM of likely need for other venue. I will follow up today. [phone]
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
## 2017-07-14 NOTE — Plan of Care (Signed)
  Problem: Health Behavior/Discharge Planning: Goal: Ability to manage health-related needs will improve Outcome: Progressing   Problem: Clinical Measurements: Goal: Ability to maintain clinical measurements within normal limits will improve Outcome: Progressing Goal: Will remain free from infection Outcome: Progressing Goal: Diagnostic test results will improve Outcome: Progressing Goal: Respiratory complications will improve Outcome: Progressing Goal: Cardiovascular complication will be avoided Outcome: Progressing   Problem: Activity: Goal: Risk for activity intolerance will decrease Outcome: Progressing   Problem: Coping: Goal: Level of anxiety will decrease Outcome: Progressing   Problem: Elimination: Goal: Will not experience complications related to bowel motility Outcome: Progressing Goal: Will not experience complications related to urinary retention Outcome: Progressing   Problem: Pain Managment: Goal: General experience of comfort will improve Outcome: Progressing   Problem: Skin Integrity: Goal: Risk for impaired skin integrity will decrease Outcome: Progressing   Problem: Clinical Measurements: Goal: Ability to maintain clinical measurements within normal limits will improve Outcome: Progressing Goal: Postoperative complications will be avoided or minimized Outcome: Progressing   Problem: Skin Integrity: Goal: Demonstration of wound healing without infection will improve Outcome: Progressing

## 2017-07-14 NOTE — Social Work (Signed)
CSW completed SBIRT. Pt has been clean and sober of alcohol for about 5 years. Pt reports that he successfully completed treatment at that time. He declined resources and confirmed that he has not engaged in alcohol use since that time. Pt confirmed that he has a very good support system and knows how to ask for help and use resources. CSW used positive motivation and support to encourage his continued sobriety. No intervention needed at this time.  Keene BreathPatricia Brecken Dewoody, LCSW Clinical Social Worker [phone]
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
## 2017-07-15 ENCOUNTER — Encounter (HOSPITAL_COMMUNITY): Payer: Self-pay | Admitting: Physical Medicine and Rehabilitation

## 2017-07-15 ENCOUNTER — Other Ambulatory Visit: Payer: Self-pay

## 2017-07-15 ENCOUNTER — Inpatient Hospital Stay (HOSPITAL_COMMUNITY): Payer: BLUE CROSS/BLUE SHIELD

## 2017-07-15 ENCOUNTER — Inpatient Hospital Stay (HOSPITAL_COMMUNITY)
Admission: RE | Admit: 2017-07-15 | Discharge: 2017-07-22 | DRG: 560 | Disposition: A | Discharge status: Home / Self Care | Payer: BLUE CROSS/BLUE SHIELD | Source: Intra-hospital | Attending: Physical Medicine & Rehabilitation | Admitting: Physical Medicine & Rehabilitation

## 2017-07-15 ENCOUNTER — Encounter (HOSPITAL_COMMUNITY): Payer: Self-pay | Admitting: *Deleted

## 2017-07-15 DIAGNOSIS — R0602 Shortness of breath: Secondary | ICD-10-CM

## 2017-07-15 DIAGNOSIS — R0902 Hypoxemia: Secondary | ICD-10-CM | POA: Diagnosis present

## 2017-07-15 DIAGNOSIS — E8809 Other disorders of plasma-protein metabolism, not elsewhere classified: Secondary | ICD-10-CM | POA: Diagnosis present

## 2017-07-15 DIAGNOSIS — S060X9D Concussion with loss of consciousness of unspecified duration, subsequent encounter: Secondary | ICD-10-CM

## 2017-07-15 DIAGNOSIS — T1490XA Injury, unspecified, initial encounter: Secondary | ICD-10-CM | POA: Diagnosis present

## 2017-07-15 DIAGNOSIS — S2241XD Multiple fractures of ribs, right side, subsequent encounter for fracture with routine healing: Secondary | ICD-10-CM

## 2017-07-15 DIAGNOSIS — S7221XD Displaced subtrochanteric fracture of right femur, subsequent encounter for closed fracture with routine healing: Principal | ICD-10-CM

## 2017-07-15 DIAGNOSIS — F17210 Nicotine dependence, cigarettes, uncomplicated: Secondary | ICD-10-CM | POA: Diagnosis present

## 2017-07-15 DIAGNOSIS — D62 Acute posthemorrhagic anemia: Secondary | ICD-10-CM | POA: Diagnosis present

## 2017-07-15 DIAGNOSIS — S2241XS Multiple fractures of ribs, right side, sequela: Secondary | ICD-10-CM | POA: Diagnosis not present

## 2017-07-15 DIAGNOSIS — S270XXD Traumatic pneumothorax, subsequent encounter: Secondary | ICD-10-CM | POA: Diagnosis not present

## 2017-07-15 DIAGNOSIS — S2221XD Fracture of manubrium, subsequent encounter for fracture with routine healing: Secondary | ICD-10-CM

## 2017-07-15 DIAGNOSIS — F419 Anxiety disorder, unspecified: Secondary | ICD-10-CM | POA: Diagnosis present

## 2017-07-15 DIAGNOSIS — K5900 Constipation, unspecified: Secondary | ICD-10-CM | POA: Diagnosis present

## 2017-07-15 DIAGNOSIS — F418 Other specified anxiety disorders: Secondary | ICD-10-CM

## 2017-07-15 DIAGNOSIS — S2249XA Multiple fractures of ribs, unspecified side, initial encounter for closed fracture: Secondary | ICD-10-CM

## 2017-07-15 DIAGNOSIS — S7221XS Displaced subtrochanteric fracture of right femur, sequela: Secondary | ICD-10-CM | POA: Diagnosis not present

## 2017-07-15 DIAGNOSIS — T07XXXA Unspecified multiple injuries, initial encounter: Secondary | ICD-10-CM | POA: Diagnosis not present

## 2017-07-15 DIAGNOSIS — K5901 Slow transit constipation: Secondary | ICD-10-CM | POA: Diagnosis present

## 2017-07-15 DIAGNOSIS — S12500D Unspecified displaced fracture of sixth cervical vertebra, subsequent encounter for fracture with routine healing: Secondary | ICD-10-CM | POA: Diagnosis not present

## 2017-07-15 DIAGNOSIS — S7221XA Displaced subtrochanteric fracture of right femur, initial encounter for closed fracture: Secondary | ICD-10-CM | POA: Diagnosis present

## 2017-07-15 DIAGNOSIS — J939 Pneumothorax, unspecified: Secondary | ICD-10-CM

## 2017-07-15 DIAGNOSIS — R339 Retention of urine, unspecified: Secondary | ICD-10-CM | POA: Diagnosis present

## 2017-07-15 DIAGNOSIS — R609 Edema, unspecified: Secondary | ICD-10-CM | POA: Diagnosis not present

## 2017-07-15 LAB — URINALYSIS, ROUTINE W REFLEX MICROSCOPIC
BACTERIA UA: NONE SEEN
Bilirubin Urine: NEGATIVE
GLUCOSE, UA: NEGATIVE mg/dL
KETONES UR: 20 mg/dL — AB
LEUKOCYTES UA: NEGATIVE
NITRITE: NEGATIVE
PROTEIN: NEGATIVE mg/dL
Specific Gravity, Urine: 1.027 (ref 1.005–1.030)
Squamous Epithelial / LPF: NONE SEEN
pH: 5 (ref 5.0–8.0)

## 2017-07-15 LAB — GLUCOSE, CAPILLARY: GLUCOSE-CAPILLARY: 101 mg/dL — AB (ref 65–99)

## 2017-07-15 MED ORDER — SILVER SULFADIAZINE 1 % EX CREA
TOPICAL_CREAM | Freq: Two times a day (BID) | CUTANEOUS | Status: DC
Start: 2017-07-15 — End: 2017-07-20
  Administered 2017-07-15 – 2017-07-19 (×8): via TOPICAL
  Administered 2017-07-19: 1 via TOPICAL
  Filled 2017-07-15: qty 85

## 2017-07-15 MED ORDER — POLYETHYLENE GLYCOL 3350 17 G PO PACK
17.0000 g | PACK | Freq: Two times a day (BID) | ORAL | Status: DC
  Administered 2017-07-15 – 2017-07-17 (×3): 17 g via ORAL
  Filled 2017-07-15 (×5): qty 1

## 2017-07-15 MED ORDER — ACETAMINOPHEN 325 MG PO TABS
650.0000 mg | ORAL_TABLET | Freq: Three times a day (TID) | ORAL | Status: DC
  Administered 2017-07-15 – 2017-07-22 (×29): 650 mg via ORAL
  Filled 2017-07-15 (×29): qty 2

## 2017-07-15 MED ORDER — BISACODYL 10 MG RE SUPP: 10.0000 mg | Freq: Every day | RECTAL | Status: DC | PRN

## 2017-07-15 MED ORDER — TAMSULOSIN HCL 0.4 MG PO CAPS
0.4000 mg | ORAL_CAPSULE | Freq: Every day | ORAL | Status: DC
  Administered 2017-07-16 – 2017-07-22 (×7): 0.4 mg via ORAL
  Filled 2017-07-15 (×7): qty 1

## 2017-07-15 MED ORDER — PROCHLORPERAZINE EDISYLATE 5 MG/ML IJ SOLN: 5.0000 mg | Freq: Four times a day (QID) | INTRAMUSCULAR | Status: DC | PRN

## 2017-07-15 MED ORDER — GUAIFENESIN ER 600 MG PO TB12
600.0000 mg | ORAL_TABLET | Freq: Two times a day (BID) | ORAL | Status: DC
  Administered 2017-07-15: 600 mg via ORAL
  Filled 2017-07-15: qty 1

## 2017-07-15 MED ORDER — SENNOSIDES-DOCUSATE SODIUM 8.6-50 MG PO TABS: 2.0000 | ORAL_TABLET | Freq: Every evening | ORAL | Status: DC | PRN

## 2017-07-15 MED ORDER — ALUM & MAG HYDROXIDE-SIMETH 200-200-20 MG/5ML PO SUSP: 30.0000 mL | ORAL | Status: DC | PRN

## 2017-07-15 MED ORDER — TRAMADOL HCL 50 MG PO TABS
50.0000 mg | ORAL_TABLET | Freq: Four times a day (QID) | ORAL | Status: DC | PRN
  Administered 2017-07-22: 50 mg via ORAL
  Filled 2017-07-15: qty 1

## 2017-07-15 MED ORDER — PROCHLORPERAZINE MALEATE 5 MG PO TABS: 5.0000 mg | ORAL_TABLET | Freq: Four times a day (QID) | ORAL | Status: DC | PRN

## 2017-07-15 MED ORDER — DIPHENHYDRAMINE HCL 12.5 MG/5ML PO ELIX: 12.5000 mg | ORAL_SOLUTION | Freq: Four times a day (QID) | ORAL | Status: DC | PRN

## 2017-07-15 MED ORDER — PROCHLORPERAZINE 25 MG RE SUPP: 12.5000 mg | Freq: Four times a day (QID) | RECTAL | Status: DC | PRN

## 2017-07-15 MED ORDER — METHOCARBAMOL 500 MG PO TABS
500.0000 mg | ORAL_TABLET | Freq: Four times a day (QID) | ORAL | Status: DC | PRN
  Administered 2017-07-15 – 2017-07-20 (×8): 500 mg via ORAL
  Filled 2017-07-15 (×8): qty 1

## 2017-07-15 MED ORDER — BETHANECHOL CHLORIDE 25 MG PO TABS
25.0000 mg | ORAL_TABLET | Freq: Three times a day (TID) | ORAL | Status: DC
  Administered 2017-07-15 – 2017-07-22 (×22): 25 mg via ORAL
  Filled 2017-07-15 (×22): qty 1

## 2017-07-15 MED ORDER — LIDOCAINE 5 % EX PTCH
2.0000 | MEDICATED_PATCH | CUTANEOUS | Status: DC
  Administered 2017-07-16 – 2017-07-22 (×7): 2 via TRANSDERMAL
  Filled 2017-07-15 (×7): qty 2

## 2017-07-15 MED ORDER — FLEET ENEMA 7-19 GM/118ML RE ENEM
1.0000 | ENEMA | Freq: Once | RECTAL | Status: DC | PRN
Start: 2017-07-15 — End: 2017-07-22

## 2017-07-15 MED ORDER — ENOXAPARIN SODIUM 40 MG/0.4ML ~~LOC~~ SOLN
40.0000 mg | SUBCUTANEOUS | Status: DC
  Administered 2017-07-15 – 2017-07-21 (×7): 40 mg via SUBCUTANEOUS
  Filled 2017-07-15 (×7): qty 0.4

## 2017-07-15 MED ORDER — OXYCODONE HCL 5 MG PO TABS
5.0000 mg | ORAL_TABLET | ORAL | Status: DC | PRN
  Administered 2017-07-15 – 2017-07-21 (×13): 10 mg via ORAL
  Filled 2017-07-15 (×13): qty 2

## 2017-07-15 MED ORDER — GUAIFENESIN-DM 100-10 MG/5ML PO SYRP: 5.0000 mL | ORAL_SOLUTION | Freq: Four times a day (QID) | ORAL | Status: DC | PRN

## 2017-07-15 MED ORDER — TRAZODONE HCL 50 MG PO TABS: 25.0000 mg | ORAL_TABLET | Freq: Every evening | ORAL | Status: DC | PRN
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
## 2017-07-15 NOTE — Progress Notes (Signed)
Saw patient who did not appear to be in any distress.  His skin tone was pink.  His RR was 20.  He did have decreased breath sounds in the right lung, but was not short of breat.  Will base decision of whether or not he needs a chest tube on CXR tomorrow.  Marta LamasJames O. Gae BonWyatt, III, MD, FACS [phone] Trauma Surgeon

## 2017-07-15 NOTE — Progress Notes (Signed)
Discussed CXR reports with Dr. Janee Mornhompson. He'll have on call provider check up on patient.  Will order continuous pulse ox. Check follow up CXR tomorrow.
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
## 2017-07-15 NOTE — Progress Notes (Signed)
Received pt. As a new admission.Pt. Was oriented to the unit routine,safety plan was explained,fall prevention plan was explained and signed.
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
## 2017-07-15 NOTE — Progress Notes (Signed)
[facility]  Patient was taken off of the cardiac monitor

## 2017-07-16 ENCOUNTER — Inpatient Hospital Stay (HOSPITAL_COMMUNITY): Payer: BLUE CROSS/BLUE SHIELD

## 2017-07-16 ENCOUNTER — Inpatient Hospital Stay (HOSPITAL_COMMUNITY): Payer: BLUE CROSS/BLUE SHIELD | Admitting: Physical Therapy

## 2017-07-16 DIAGNOSIS — J939 Pneumothorax, unspecified: Secondary | ICD-10-CM

## 2017-07-16 DIAGNOSIS — S2249XA Multiple fractures of ribs, unspecified side, initial encounter for closed fracture: Secondary | ICD-10-CM

## 2017-07-16 DIAGNOSIS — F418 Other specified anxiety disorders: Secondary | ICD-10-CM

## 2017-07-16 DIAGNOSIS — S7221XS Displaced subtrochanteric fracture of right femur, sequela: Secondary | ICD-10-CM

## 2017-07-16 DIAGNOSIS — S2241XS Multiple fractures of ribs, right side, sequela: Secondary | ICD-10-CM

## 2017-07-16 DIAGNOSIS — T1490XA Injury, unspecified, initial encounter: Secondary | ICD-10-CM

## 2017-07-16 LAB — CBC WITH DIFFERENTIAL/PLATELET
Basophils Absolute: 0 10*3/uL (ref 0.0–0.1)
Basophils Relative: 0 %
EOS ABS: 0.2 10*3/uL (ref 0.0–0.7)
EOS PCT: 3 %
HCT: 32.7 % — ABNORMAL LOW (ref 39.0–52.0)
Hemoglobin: 11.2 g/dL — ABNORMAL LOW (ref 13.0–17.0)
Lymphocytes Relative: 21 %
Lymphs Abs: 1.4 10*3/uL (ref 0.7–4.0)
MCH: 30.7 pg (ref 26.0–34.0)
MCHC: 34.3 g/dL (ref 30.0–36.0)
MCV: 89.6 fL (ref 78.0–100.0)
Monocytes Absolute: 0.7 10*3/uL (ref 0.1–1.0)
Monocytes Relative: 11 %
Neutro Abs: 4.5 10*3/uL (ref 1.7–7.7)
Neutrophils Relative %: 65 %
PLATELETS: 231 10*3/uL (ref 150–400)
RBC: 3.65 MIL/uL — AB (ref 4.22–5.81)
RDW: 14 % (ref 11.5–15.5)
WBC: 6.8 10*3/uL (ref 4.0–10.5)

## 2017-07-16 LAB — COMPREHENSIVE METABOLIC PANEL
ALT: 22 U/L (ref 17–63)
AST: 32 U/L (ref 15–41)
Albumin: 2.5 g/dL — ABNORMAL LOW (ref 3.5–5.0)
Alkaline Phosphatase: 44 U/L (ref 38–126)
Anion gap: 11 (ref 5–15)
BUN: 14 mg/dL (ref 6–20)
CHLORIDE: 102 mmol/L (ref 101–111)
CO2: 23 mmol/L (ref 22–32)
CREATININE: 0.72 mg/dL (ref 0.61–1.24)
Calcium: 8.4 mg/dL — ABNORMAL LOW (ref 8.9–10.3)
GFR calc non Af Amer: >60 mL/min (ref >60)
Glucose, Bld: 110 mg/dL — ABNORMAL HIGH (ref 65–99)
Potassium: 3.9 mmol/L (ref 3.5–5.1)
SODIUM: 136 mmol/L (ref 135–145)
Total Bilirubin: 1 mg/dL (ref 0.3–1.2)
Total Protein: 5.9 g/dL — ABNORMAL LOW (ref 6.5–8.1)

## 2017-07-16 LAB — URINE CULTURE: CULTURE: NO GROWTH

## 2017-07-16 MED ORDER — LORAZEPAM 0.5 MG PO TABS: 0.5000 mg | ORAL_TABLET | Freq: Four times a day (QID) | ORAL | Status: DC | PRN
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
## 2017-07-16 NOTE — Progress Notes (Signed)
Occupational Therapy Session Note  Patient Details  Name: Bradley Pearson MRN: [id number] Date of Birth: 1960-11-13  Today's Date: 07/16/2017 OT Individual Time: 1300-1345 OT Individual Time Calculation (min): 45 min    Short Term Goals: Week 1:  OT Short Term Goal 1 (Week 1): n/a d/t ELOS  Skilled Therapeutic Interventions/Progress Updates:  Balance/vestibular training;Discharge planning;Pain management;Self Care/advanced ADL retraining;Therapeutic Activities;UE/LE Coordination activities;Disease mangement/prevention;Functional mobility training;Patient/family education;Skin care/wound managment;Therapeutic Exercise;Community reintegration;DME/adaptive equipment instruction;Psychosocial support;UE/LE Strength taining/ROM   1:1. Pt propels w/c part wayto dayroom with increased time. OT demo use of reacher, sock aide and shoe funnel to doff/don socks and shoes. Pt completes doffing/donning socks with AE with min instructional cues. Pt completes standing activity at high low table to work on functional reach and balance while stabilizing with 1 UE in prep for LB dressing. Pt retrieves clothes pins with RUE to cross midline and place on high target. Exited session with pt seated in w/c call light in reachand all need smet  Therapy Documentation Precautions:  Precautions Precautions: Fall Restrictions Weight Bearing Restrictions: Yes RLE Weight Bearing: Touchdown weight bearing General:   Vital Signs:  Pain: Pain Assessment Pain Scale: 0-10 Pain Score: 4  ADL:    See Function Navigator for Current Functional Status.   Therapy/Group: Individual Therapy  Shon HaleStephanie M Martha Soltys 07/16/2017, 1:46 PM
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
## 2017-07-17 ENCOUNTER — Inpatient Hospital Stay (HOSPITAL_COMMUNITY): Payer: BLUE CROSS/BLUE SHIELD

## 2017-07-17 DIAGNOSIS — R609 Edema, unspecified: Secondary | ICD-10-CM

## 2017-07-17 NOTE — Progress Notes (Signed)
Bilateral lower extremity venous duplex has been completed. Negative for DVT.  07/17/17 9:58 AM Olen CordialGreg Zyad Boomer RVT
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
## 2017-07-17 NOTE — Progress Notes (Signed)
Occupational Therapy Session Note  Patient Details  Name: Bradley Pearson MRN: [id number] Date of Birth: Nov 25, 1960  Today's Date: 07/17/2017 OT Individual Time: 1010-1104 OT Individual Time Calculation (min): 54 min    Short Term Goals: Week 1:  OT Short Term Goal 1 (Week 1): n/a d/t ELOS  Skilled Therapeutic Interventions/Progress Updates:    1:1. Pt received after doppler with no sign of DVT from preliminary report. Pt completes bathing at shower level with touchign A for w/c<>TTB with VC for hand placement. Pt bathes at sit to stand levelw ith CGA for standing balance only. Pt with good demo of carryover of AE use from yesterday requiring only 2 VC for use of sock aide/shoe funnel to don socks and shoes. Pt grroms at sink in seated. Pt completes walk in shower transfer simulated to d/c environment with demonstration of posterior method by OT. PT able to return demo with min A for balance. Exited session with pt seated in w/c and call light in reach.  Therapy Documentation Precautions:  Precautions Precautions: Fall Restrictions Weight Bearing Restrictions: Yes RLE Weight Bearing: Touchdown weight bearing General:    See Function Navigator for Current Functional Status.   Therapy/Group: Individual Therapy  Shon HaleStephanie M Cerenity Goshorn 07/17/2017, 11:05 AM

## 2017-07-18 ENCOUNTER — Inpatient Hospital Stay (HOSPITAL_COMMUNITY): Payer: BLUE CROSS/BLUE SHIELD | Admitting: Physical Therapy

## 2017-07-18 ENCOUNTER — Inpatient Hospital Stay (HOSPITAL_COMMUNITY): Payer: BLUE CROSS/BLUE SHIELD

## 2017-07-18 ENCOUNTER — Inpatient Hospital Stay (HOSPITAL_COMMUNITY): Payer: BLUE CROSS/BLUE SHIELD | Admitting: Occupational Therapy

## 2017-07-18 DIAGNOSIS — S2241XD Multiple fractures of ribs, right side, subsequent encounter for fracture with routine healing: Secondary | ICD-10-CM

## 2017-07-18 DIAGNOSIS — S7221XD Displaced subtrochanteric fracture of right femur, subsequent encounter for closed fracture with routine healing: Principal | ICD-10-CM

## 2017-07-18 MED ORDER — POLYETHYLENE GLYCOL 3350 17 G PO PACK
17.0000 g | PACK | Freq: Every day | ORAL | Status: DC
  Administered 2017-07-19 – 2017-07-22 (×3): 17 g via ORAL
  Filled 2017-07-18 (×5): qty 1

## 2017-07-18 MED ORDER — PRO-STAT SUGAR FREE PO LIQD
30.0000 mL | Freq: Two times a day (BID) | ORAL | Status: DC
  Administered 2017-07-18 – 2017-07-22 (×9): 30 mL via ORAL
  Filled 2017-07-18 (×9): qty 30
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
## 2017-07-18 NOTE — Progress Notes (Signed)
Physical Therapy Session Note  Patient Details  Name: Bradley Pearson MRN: [id number] Date of Birth: 1960-06-28  Today's Date: 07/18/2017 PT Individual Time: 3794-3276 PT Individual Time Calculation (min): 87 min   Short Term Goals: Week 1:  PT Short Term Goal 1 (Week 1): =LTGs due to ELOS  Skilled Therapeutic Interventions/Progress Updates:    c/o 3-4/10 pain at rest, reports this is his baseline right now.  Session focus on activity tolerance and strengthening through w/c mobility, gait training, and therex.    Pt propels w/c throughout hospital, out to main entrance, with BUEs and intermittent use of LLE for mobility, UE strengthening, and cardiovascular endurance.  Short rest breaks provided as needed and pt able to negotiate around obstacles, over thresholds, and uneven surfaces.  Gait training 2x20' +50' with Rw and cues for maintaining TDWB but overall supervision.  Reports increased pain in chest when attempting to push down through UEs to allow LLE to swing through.  PT instructed pt in 2x10 reps bilateral LE therex (2# weight in LLE): LAQ, hip flexion, pillow squeezes, and hip abd against level 3 theraband.  Rest breaks provided between each set/exercise.  Pt returned to room in w/c at end of session, positioned upright in w/c with call bell in reach and needs met.   Therapy Documentation Precautions:  Precautions Precautions: Fall Restrictions Weight Bearing Restrictions: Yes RLE Weight Bearing: Touchdown weight bearing   See Function Navigator for Current Functional Status.   Therapy/Group: Individual Therapy  Michel Santee 07/18/2017, 4:38 PM

## 2017-07-18 NOTE — Care Management Note (Signed)
[facility] Individual Statement of Services  Patient Name:  Bradley Pearson  Date:  07/18/2017  Welcome to the [facility].  Our goal is to provide you with an individualized program based on your diagnosis and situation, designed to meet your specific needs.  With this comprehensive rehabilitation program, you will be expected to participate in at least 3 hours of rehabilitation therapies Monday-Friday, with modified therapy programming on the weekends.  Your rehabilitation program will include the following services:  Physical Therapy (PT), Occupational Therapy (OT), 24 hour per day rehabilitation nursing, Therapeutic Recreaction (TR), Case Management (Social Worker), Rehabilitation Medicine, Nutrition Services and Pharmacy Services  Weekly team conferences will be held on Wednesday to discuss your progress.  Your Social Worker will talk with you frequently to get your input and to update you on team discussions.  Team conferences with you and your family in attendance may also be held.  Expected length of stay: 5-7 days  Overall anticipated outcome: mod/i level  Depending on your progress and recovery, your program may change. Your Social Worker will coordinate services and will keep you informed of any changes. Your Social Worker's name and contact numbers are listed  below.  The following services may also be recommended but are not provided by the [facility]:   Driving Evaluations  Home Health Rehabiltiation Services  Outpatient Rehabilitation Services  Vocational Rehabilitation   Arrangements will be made to provide these services after discharge if needed.  Arrangements include referral to agencies that provide these services.  Your insurance has been verified to be:  BCBS Your primary doctor is:  Tally JoeDavid Swayne  Pertinent information will be shared with your doctor and your insurance company.  Social Worker:  Dossie DerBecky  Meliton Samad, SW [phone] or [phone]  Information discussed with and copy given to patient by: Lucy Chrisupree, Gursimran Litaker G, 07/18/2017, 11:08 AM

## 2017-07-18 NOTE — Progress Notes (Signed)
Subjective/Complaints: C/o stools too loose, had 2 BM yesterday one this am. No abd pain , no nausea  ROS- neg CP except with movement on R side.  No SOB, no bladder or bowel incont Objective: Vital Signs: Blood pressure 123/78, pulse 77, temperature 98.4 F (36.9 C), temperature source Oral, resp. rate 20, SpO2 96 %. Dg Chest 2 View  Result Date: 07/16/2017 CLINICAL DATA:  Pneumothorax EXAM: CHEST - 2 VIEW COMPARISON:  07/15/2017 FINDINGS: Right apical pneumothorax unchanged measuring approximately 15 mm. Lungs are clear without infiltrate . Negative for heart failure. Small right pleural effusion. IMPRESSION: 15 mm right apical pneumothorax unchanged with small right effusion. Negative for heart failure. Electronically Signed   By: Franchot Gallo M.D.   On: 07/16/2017 10:48   Results for orders placed or performed during the hospital encounter of 07/15/17 (from the past 72 hour(s))  Urine culture     Status: None   Collection Time: 07/15/17  5:14 PM  Result Value Ref Range   Specimen Description URINE, CATHETERIZED    Special Requests NONE    Culture      NO GROWTH Performed at [facility], [address], [place], [place] [address]    Report Status 07/16/2017 FINAL   Urinalysis, Routine w reflex microscopic     Status: Abnormal   Collection Time: 07/15/17  5:40 PM  Result Value Ref Range   Color, Urine YELLOW YELLOW   APPearance CLEAR CLEAR   Specific Gravity, Urine 1.027 1.005 - 1.030   pH 5.0 5.0 - 8.0   Glucose, UA NEGATIVE NEGATIVE mg/dL   Hgb urine dipstick SMALL (A) NEGATIVE   Bilirubin Urine NEGATIVE NEGATIVE   Ketones, ur 20 (A) NEGATIVE mg/dL   Protein, ur NEGATIVE NEGATIVE mg/dL   Nitrite NEGATIVE NEGATIVE   Leukocytes, UA NEGATIVE NEGATIVE   RBC / HPF 0-5 0 - 5 RBC/hpf   WBC, UA 0-5 0 - 5 WBC/hpf   Bacteria, UA NONE SEEN NONE SEEN   Squamous Epithelial / LPF NONE SEEN NONE SEEN   Mucus PRESENT     Comment: Performed at [facility], [place]  [address], [place], [place] [address]  CBC WITH DIFFERENTIAL     Status: Abnormal   Collection Time: 07/16/17  4:54 AM  Result Value Ref Range   WBC 6.8 4.0 - 10.5 K/uL   RBC 3.65 (L) 4.22 - 5.81 MIL/uL   Hemoglobin 11.2 (L) 13.0 - 17.0 g/dL   HCT 32.7 (L) 39.0 - 52.0 %   MCV 89.6 78.0 - 100.0 fL   MCH 30.7 26.0 - 34.0 pg   MCHC 34.3 30.0 - 36.0 g/dL   RDW 14.0 11.5 - 15.5 %   Platelets 231 150 - 400 K/uL   Neutrophils Relative % 65 %   Neutro Abs 4.5 1.7 - 7.7 K/uL   Lymphocytes Relative 21 %   Lymphs Abs 1.4 0.7 - 4.0 K/uL   Monocytes Relative 11 %   Monocytes Absolute 0.7 0.1 - 1.0 K/uL   Eosinophils Relative 3 %   Eosinophils Absolute 0.2 0.0 - 0.7 K/uL   Basophils Relative 0 %   Basophils Absolute 0.0 0.0 - 0.1 K/uL    Comment: Performed at [facility] [address], [place],  [address]  Comprehensive metabolic panel     Status: Abnormal   Collection Time: 07/16/17  4:54 AM  Result Value Ref Range   Sodium 136 135 - 145 mmol/L   Potassium 3.9  3.5 - 5.1 mmol/L   Chloride 102 101 - 111 mmol/L   CO2 23 22 - 32 mmol/L   Glucose, Bld 110 (H) 65 - 99 mg/dL   BUN 14 6 - 20 mg/dL   Creatinine, Ser 0.72 0.61 - 1.24 mg/dL   Calcium 8.4 (L) 8.9 - 10.3 mg/dL   Total Protein 5.9 (L) 6.5 - 8.1 g/dL   Albumin 2.5 (L) 3.5 - 5.0 g/dL   AST 32 15 - 41 U/L   ALT 22 17 - 63 U/L   Alkaline Phosphatase 44 38 - 126 U/L   Total Bilirubin 1.0 0.3 - 1.2 mg/dL   GFR calc non Af Amer >60 >60 mL/min   GFR calc Af Amer >60 >60 mL/min    Comment: (NOTE) The eGFR has been calculated using the CKD EPI equation. This calculation has not been validated in all clinical situations. eGFR's persistently <60 mL/min signify possible Chronic Kidney Disease.    Anion gap 11 5 - 15    Comment: Performed at [facility] [address], [place], [place] [address]     HEENT: normal Cardio: RRR and no murmur Resp: CTA B/L and unlabored GI: BS positive and NT, ND Extremity:  Pulses  positive and No Edema Skin:   Wound Right hip wound with post op dressing Neuro: Alert/Oriented and Abnormal Motor 5/5 in BUE, LLE , 4.5 RLE Musc/Skel:  Other Pain at Right sterno costal jct and lateral ribs Gen NAD   Assessment/Plan: 1. Functional deficits secondary to right subtrochanteric femur fx, multiple right rib fxs, C6 TVP fx,  TDWB RLE  which require 3+ hours per day of interdisciplinary therapy in a comprehensive inpatient rehab setting. Physiatrist is providing close team supervision and 24 hour management of active medical problems listed below. Physiatrist and rehab team continue to assess barriers to discharge/monitor patient progress toward functional and medical goals. FIM: Function - Bathing Position: Sitting EOB Body parts bathed by patient: Right arm, Left arm, Chest, Abdomen, Front perineal area, Buttocks, Right upper leg, Left upper leg Body parts bathed by helper: Right lower leg, Left lower leg, Back Assist Level: Touching or steadying assistance(Pt > 75%)  Function- Upper Body Dressing/Undressing What is the patient wearing?: Pull over shirt/dress Pull over shirt/dress - Perfomed by patient: Thread/unthread right sleeve, Thread/unthread left sleeve, Put head through opening, Pull shirt over trunk Assist Level: Supervision or verbal cues Function - Lower Body Dressing/Undressing What is the patient wearing?: Pants, Non-skid slipper socks Pants- Performed by patient: Thread/unthread right pants leg, Thread/unthread left pants leg, Pull pants up/down Non-skid slipper socks- Performed by patient: Don/doff left sock Non-skid slipper socks- Performed by helper: Don/doff right sock Assist for footwear: Partial/moderate assist Assist for lower body dressing: Touching or steadying assistance (Pt > 75%)  Function - Toileting Toileting activity did not occur: No continent bowel/bladder event Toileting steps completed by patient: Adjust clothing prior to toileting,  Performs perineal hygiene, Adjust clothing after toileting Toileting steps completed by helper: Adjust clothing prior to toileting, Adjust clothing after toileting Toileting Assistive Devices: Grab bar or rail Assist level: Touching or steadying assistance (Pt.75%)  Function - Air cabin crew transfer assistive device: Grab bar Assist level to toilet: Touching or steadying assistance (Pt > 75%) Assist level from toilet: Touching or steadying assistance (Pt > 75%)  Function - Chair/bed transfer Chair/bed transfer method: Stand pivot Chair/bed transfer assist level: Touching or steadying assistance (Pt > 75%) Chair/bed transfer assistive device: Armrests, Walker Chair/bed transfer details:  Verbal cues for technique, Verbal cues for precautions/safety  Function - Locomotion: Wheelchair Will patient use wheelchair at discharge?: Yes Type: Manual Max wheelchair distance: 150' Assist Level: Supervision or verbal cues Assist Level: Supervision or verbal cues Assist Level: Supervision or verbal cues Turns around,maneuvers to table,bed, and toilet,negotiates 3% grade,maneuvers on rugs and over doorsills: No Function - Locomotion: Ambulation Assistive device: Walker-rolling Max distance: 20' Assist level: Touching or steadying assistance (Pt > 75%) Assist level: Touching or steadying assistance (Pt > 75%) Walk 50 feet with 2 turns activity did not occur: Safety/medical concerns Walk 150 feet activity did not occur: Safety/medical concerns Walk 10 feet on uneven surfaces activity did not occur: Safety/medical concerns  Function - Comprehension Comprehension: Auditory Comprehension assist level: Follows complex conversation/direction with no assist  Function - Expression Expression: Verbal Expression assist level: Expresses complex ideas: With no assist  Function - Social Interaction Social Interaction assist level: Interacts appropriately with others - No medications  needed.  Function - Problem Solving Problem solving assist level: Solves complex problems: Recognizes & self-corrects  Function - Memory Memory assist level: Complete Independence: No helper Patient normally able to recall (first 3 days only): Current season, Location of own room, That he or she is in a hospital  Medical Problem List and Plan: 1.Functional and mobility deficitssecondary to right subtrochanteric femur fx, multiple right rib fxs, C6 TVP fx,  TDWB RLE -Cont CIR PT, OT 2. DVT Prophylaxis/Anticoagulation: Pharmaceutical:Lovenox, doppler 4/7 neg for DVT 3. Pain Management:Will add robaxin to help manage muscle spasms RLE and lidocaine patches for persistent right chest wall pain. 4. Mood:LCSW to follow for evaluation and support. 5. Neuropsych: This patientiscapable of making decisions on hisown behalf. 6. Skin/Wound Care:  7. Fluids/Electrolytes/Nutrition:Monitor I/O. BMET nl 4/6 8. Right femur fractures/p ORIF: TDWB. 9. Right rib and manubrial fracture:Reports constant pain with some hypoxia with activity. Will check follow up CXR. Encourage IS. Will add lidocaine patch to right lateral ribs and to manubrium.  10 Urinary retention:UA neg Ucx NG. Improving Continue urecholine and Flomax. 11.Constipation:  Will decrease Miralax to qd Last BM 4/7- c/o of excessively loose stools 12.  Hypoalb- prostat   LOS (Days) 3 A FACE TO FACE EVALUATION WAS PERFORMED  Charlett Blake 07/18/2017, 7:35 AM

## 2017-07-18 NOTE — Progress Notes (Signed)
Occupational Therapy Session Note  Patient Details  Name: Bradley Pearson MRN: [id number] Date of Birth: 23-Nov-1960  Today's Date: 07/18/2017 OT Individual Time: 1749-4496 OT Individual Time Calculation (min): 60 min    Short Term Goals: Week 1:  OT Short Term Goal 1 (Week 1): n/a d/t ELOS  Skilled Therapeutic Interventions/Progress Updates:    Pt seen this session for ADL training with a focus on functional mobility and maintaining WB precautions. Pt was able to get out of bed, stand pivot with RW to w/c, w/c >< toilet with RW, toilet, bathe and dress at sink, use AE of reacher, sock aid, and shoe funnel all with close S.  Pt maintained precautions without cues. Pt was reminded to lock his breaks before reaching forward with donning LB clothing.    Pt provided with Level 1 theraband for gentle Upper back exercises followed by stretches.  Pt worked on gentle double arm rows 10 x3 and then single arm rows 10x each side.  He stated that he has been getting upper back muscle spasms at night in bed. Demonstrated with return demonstration upper back stretches. Advised pt to hold stretch 30 sec each arm several times a day, at night while sitting on EOB and during night if spasms begin.  Pt in w/c in room with all needs met.  Therapy Documentation Precautions:  Precautions Precautions: Fall Restrictions Weight Bearing Restrictions: Yes RLE Weight Bearing: Touchdown weight bearing    Vital Signs: Therapy Vitals Temp: 98.4 F (36.9 C) Temp Source: Oral Pulse Rate: 77 Resp: 20 BP: 123/78 Patient Position (if appropriate): Lying Oxygen Therapy SpO2: 96 % O2 Device: Room Air   Pain: Pt had received AM pain meds, he has pain in ribs and neck but was able to tolerate therapy well.   ADL:  See Function Navigator for Current Functional Status.   Therapy/Group: Individual Therapy  [place] 07/18/2017, 9:52 AM
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
## 2017-07-18 NOTE — Progress Notes (Signed)
Occupational Therapy Session Note  Patient Details  Name: Bradley Pearson M Trager MRN: [id number] Date of Birth: May 17, 1960  Today's Date: 07/18/2017 OT Individual Time: 7829-56211030-1115 OT Individual Time Calculation (min): 45 min    Short Term Goals: Week 1:  OT Short Term Goal 1 (Week 1): n/a d/t ELOS  Skilled Therapeutic Interventions/Progress Updates:    1;1. Pt asking questions about team coordinated care, disability and who to ask questions to find fax numbers. OT directed questions to CSW and had pt write questions down for memory strategy.  Pt propels w/c to/from all tx destinations and requires cueing to set up w/c for tranfers. Pt complets static standing activity at high low table playing game of connect for for increased standing endurance required for BADl/IADLs. Pt completes couch/chair transfers with VC for safety awareness and supervision with RW. Exited session with pt seated in w/c, call light in reach and all needs emt   Therapy Documentation Precautions:  Precautions Precautions: Fall Restrictions Weight Bearing Restrictions: Yes RLE Weight Bearing: Touchdown weight bearing Gene See Function Navigator for Current Functional Status.   Therapy/Group: Individual Therapy  Shon HaleStephanie M Vishruth Seoane 07/18/2017, 12:23 PM

## 2017-07-19 ENCOUNTER — Inpatient Hospital Stay (HOSPITAL_COMMUNITY): Payer: BLUE CROSS/BLUE SHIELD | Admitting: Occupational Therapy

## 2017-07-19 ENCOUNTER — Inpatient Hospital Stay (HOSPITAL_COMMUNITY): Payer: BLUE CROSS/BLUE SHIELD | Admitting: Physical Therapy

## 2017-07-19 DIAGNOSIS — R339 Retention of urine, unspecified: Secondary | ICD-10-CM

## 2017-07-19 DIAGNOSIS — T07XXXA Unspecified multiple injuries, initial encounter: Secondary | ICD-10-CM

## 2017-07-19 DIAGNOSIS — D62 Acute posthemorrhagic anemia: Secondary | ICD-10-CM

## 2017-07-19 NOTE — Progress Notes (Signed)
Occupational Therapy Session Note  Patient Details  Name: Bradley Pearson MRN: [id number] Date of Birth: Jun 22, 1960  Today's Date: 07/19/2017 OT Individual Time: [id number] OT Individual Time Calculation (min): 60 min    Short Term Goals: Week 1:  OT Short Term Goal 1 (Week 1): n/a d/t ELOS  Skilled Therapeutic Interventions/Progress Updates:    1:1 Self care retraining at shower level. Pt able to demonstrate functional ambulation in and out of bathroom with supervision to min guard. Practiced entering the shower backwards; hopping over a simulated threshold to access shower stall and then sit on bench facing outwards. Dressed sitting in the bathroom (sit to stand) without RW.  However he did require an arm rest/ grab bar to push up on to come into standing and maintain weight bearing status. Pt continues to practice with using AE without cuing. Performed recliner and couch transfers with RW with extra with supervision.   Therapy Documentation Precautions:  Precautions Precautions: Fall Restrictions Weight Bearing Restrictions: Yes RLE Weight Bearing: Touchdown weight bearing Pain: Reports soreness all over - in upper body as well as in right LE. Allow for rest as well as shower helped with pain  See Function Navigator for Current Functional Status.   Therapy/Group: Individual Therapy  Roney MansSmith, Bradley Pearson 07/19/2017, 8:43 AM

## 2017-07-19 NOTE — Progress Notes (Signed)
Subjective/Complaints: Pt seen lying in bed this AM.  He slept fairly overnight and states he has difficulty with getting adjusted to a comfortable position.  ROS: Denies CP, SOB, N/V/D  Objective: Vital Signs: Blood pressure 121/78, pulse 74, temperature 98.4 F (36.9 C), temperature source Oral, resp. rate 18, SpO2 97 %. No results found. No results found for this or any previous visit (from the past 72 hour(s)).   Gen NAD. Vital signs reviewed. HENT: Normocephalic, atraumatic Eyes: EOMI. No discharge.  Cardio: RRR and no JVD. Resp: CTA B/L and unlabored GI: BS positive and ND Musc/Skel:  No edema or tenderness in extremities Neuro: Alert  Motor: 5/5 in BUE 4+/5 LLE  3/5 HF, 3+/5 KE, 4/5 ADF RLE Skin:   Wound Right hip wound with post op dressing  Assessment/Plan: 1. Functional deficits secondary to right subtrochanteric femur fx, multiple right rib fxs, C6 TVP fx,  TDWB RLE  which require 3+ hours per day of interdisciplinary therapy in a comprehensive inpatient rehab setting. Physiatrist is providing close team supervision and 24 hour management of active medical problems listed below. Physiatrist and rehab team continue to assess barriers to discharge/monitor patient progress toward functional and medical goals. FIM: Function - Bathing Position: Wheelchair/chair at sink Body parts bathed by patient: Right arm, Left arm, Chest, Abdomen, Front perineal area, Buttocks, Right upper leg, Left upper leg Body parts bathed by helper: Right lower leg, Left lower leg, Back Assist Level: Touching or steadying assistance(Pt > 75%)  Function- Upper Body Dressing/Undressing What is the patient wearing?: Pull over shirt/dress Pull over shirt/dress - Perfomed by patient: Thread/unthread right sleeve, Thread/unthread left sleeve, Put head through opening, Pull shirt over trunk Assist Level: Supervision or verbal cues Function - Lower Body Dressing/Undressing What is the patient  wearing?: Underwear, Pants, Non-skid slipper socks Position: Wheelchair/chair at sink Underwear - Performed by patient: Thread/unthread right underwear leg, Thread/unthread left underwear leg, Pull underwear up/down Pants- Performed by patient: Thread/unthread right pants leg, Thread/unthread left pants leg, Pull pants up/down Non-skid slipper socks- Performed by patient: Don/doff right sock, Don/doff left sock(sock aide) Non-skid slipper socks- Performed by helper: Don/doff right sock Assist for footwear: Partial/moderate assist Assist for lower body dressing: Supervision or verbal cues  Function - Toileting Toileting activity did not occur: No continent bowel/bladder event Toileting steps completed by patient: Adjust clothing prior to toileting, Performs perineal hygiene, Adjust clothing after toileting Toileting steps completed by helper: Adjust clothing prior to toileting, Adjust clothing after toileting Toileting Assistive Devices: Grab bar or rail Assist level: Supervision or verbal cues  Function - Archivist transfer assistive device: Grab bar, Walker Assist level to toilet: Supervision or verbal cues Assist level from toilet: Supervision or verbal cues  Function - Chair/bed transfer Chair/bed transfer method: Ambulatory Chair/bed transfer assist level: Supervision or verbal cues Chair/bed transfer assistive device: Armrests, Walker Chair/bed transfer details: Verbal cues for technique, Verbal cues for precautions/safety  Function - Locomotion: Wheelchair Will patient use wheelchair at discharge?: Yes Type: Manual Max wheelchair distance: >1000 Assist Level: No help, No cues, assistive device, takes more than reasonable amount of time Assist Level: No help, No cues, assistive device, takes more than reasonable amount of time Assist Level: No help, No cues, assistive device, takes more than reasonable amount of time Turns around,maneuvers to table,bed, and  toilet,negotiates 3% grade,maneuvers on rugs and over doorsills: Yes Function - Locomotion: Ambulation Assistive device: Walker-rolling Max distance: 50 Assist level: Supervision or verbal cues Assist level: Supervision or  verbal cues Walk 50 feet with 2 turns activity did not occur: Safety/medical concerns Assist level: Supervision or verbal cues Walk 150 feet activity did not occur: Safety/medical concerns Walk 10 feet on uneven surfaces activity did not occur: Safety/medical concerns  Function - Comprehension Comprehension: Auditory Comprehension assist level: Follows complex conversation/direction with no assist  Function - Expression Expression: Verbal Expression assist level: Expresses complex ideas: With no assist  Function - Social Interaction Social Interaction assist level: Interacts appropriately with others - No medications needed.  Function - Problem Solving Problem solving assist level: Solves complex problems: Recognizes & self-corrects  Function - Memory Memory assist level: Complete Independence: No helper Patient normally able to recall (first 3 days only): Current season, Location of own room, That he or she is in a hospital  Medical Problem List and Plan: 1.Functional and mobility deficitssecondary to right subtrochanteric femur fx, multiple right rib fxs, C6 TVP fx   TDWB RLE Cont CIR    Notes reviewed, images reviewed, labs reviewed 2. DVT Prophylaxis/Anticoagulation: Pharmaceutical:Lovenox, doppler 4/7 neg for DVT 3. Pain Management: Added robaxin to help manage muscle spasms RLE and lidocaine patches for persistent right chest wall pain. 4. Mood:LCSW to follow for evaluation and support. 5. Neuropsych: This patientiscapable of making decisions on hisown behalf. 6. Skin/Wound Care:  7. Fluids/Electrolytes/Nutrition:Monitor I/O.    BMET WNL on 4/6 8. Right femur fractures/p ORIF: TDWB. 9. Right rib and manubrial fracture:Reports  constant pain with some hypoxia with activity.    Follow up CXR with persistent right pneumothorax   Encourage IS.    Added lidocaine patch to right lateral ribs and to manubrium.  10 Urinary retention:UA neg Ucx NG.    Continue urecholine and Flomax.   Check PVR 11.Constipation:  Decreased Miralax to qd 12.  Hypoalb- prostat 13. ABLA   Hb 11.2 on 4/6   Cont to monitor   LOS (Days) 4 A FACE TO FACE EVALUATION WAS PERFORMED  Ankit Karis Jubanil Patel 07/19/2017, 8:33 AM

## 2017-07-19 NOTE — Progress Notes (Signed)
Occupational Therapy Session Note  Patient Details  Name: Bradley Pearson MRN: [id number] Date of Birth: 06/14/1960  Today's Date: 07/19/2017 OT Individual Time: 1130-1200 and 1791-5056 OT Individual Time Calculation (min): 30 min and 40 min   Short Term Goals: Week 1:  OT Short Term Goal 1 (Week 1): n/a d/t ELOS  Skilled Therapeutic Interventions/Progress Updates:    Visit 1:  Pain: pt feels tightness in R hip Pt received in recliner. Discussed upcoming d/c plans. Pt's main concern was the stiffness and limited AROM of his R hip and this was making his self care more challenging.   Pt transferred with RW to w/c with S and self propelled to gym.  Pt transferred to mat to sit with B legs extended as pt leaned back on his hands.  Using a sheet to assist with sliding pt worked on gentle knee flex and extension with ankle dorsiflexion for 15 x 2 sets and then repeated exercise in supine 15 x2 which he felt was much easier. With knee bent, gentle internal/ external rotation.  Pt returned to w/c and then to room for lunch.  Visit 2:  Pain; pt feels tightness in R hip Pt seen this session to address IADL skills of managing items in the kitchen for simple meal prep with use of the RW.  Discussed and practiced strategies for moving small containers in his walker bag and larger items by sliding them on the counter with maintaining his WB precautions while standing and moving with RW.  Pt practiced retrieving items from the refrigerator.  Pt then used w/c to enter gym and transferred to the mat for seated R knee AROM of flex and ext with slides on sheet on the floor.  He attempted small ROM of abd/add but felt it was too painful.  Pt returned to w/c and returned to room with all needs met.  Therapy Documentation Precautions:  Precautions Precautions: Fall Restrictions Weight Bearing Restrictions: Yes RLE Weight Bearing: Touchdown weight bearing    Vital Signs: Therapy Vitals Temp: 98.4 F (36.9  C) Temp Source: Oral Pulse Rate: 74 Resp: 18 BP: 121/78 Patient Position (if appropriate): Lying Oxygen Therapy SpO2: 97 % O2 Device: Room Air    ADL:   See Function Navigator for Current Functional Status.   Therapy/Group: Individual Therapy  Henry 07/19/2017, 8:49 AM

## 2017-07-19 NOTE — Progress Notes (Signed)
Physical Therapy Session Note  Patient Details  Name: Bradley Pearson M Yacoub MRN: [id number] Date of Birth: 09-Nov-1960  Today's Date: 07/19/2017 PT Individual Time: 1400-1500 PT Individual Time Calculation (min): 60 min   Short Term Goals: Week 1:  PT Short Term Goal 1 (Week 1): =LTGs due to ELOS  Skilled Therapeutic Interventions/Progress Updates: Pt received seated in w/c, denies pain at rest and agreeable to treatment. W/c propulsion BUE modI throughout session. Squat pivot transfer w/c <>mat table with S, min cues for technique. Engaged pt in LE strengthening, RLE ROM exercises including sit <>stand maintaining TDWB RLE with therapist's foot under pt's foot to determine WB (maintaining precautions adequately), standing LLE heel raises, RLE hamstring and soleus stretch with strap, seated marching, long arc quads. Provided pt with handouts of those exercises via handout and email in addition to heel slides and straight leg raises which pt has previously performed. Gait 1x60', 1x25', 1x75' with RW and S, including gait around obstacles with direction changes, and side steps through narrow walk ways. Squat pivot transfer w/c <>nustep with S. Performed BUE/LLE nustep x8 min on level 6 with average 30 steps/min; pt engaged in conversation regarding hobbies to distract from physical task. Returned to room totalA in w/c d/t fatigue. Ambulatory transfer to recliner with RW and S. Remained seated in recliner, all needs in reach.      Therapy Documentation Precautions:  Precautions Precautions: Fall Restrictions Weight Bearing Restrictions: Yes RLE Weight Bearing: Touchdown weight bearing   See Function Navigator for Current Functional Status.   Therapy/Group: Individual Therapy  Vista Lawmanlizabeth J Tygielski 07/19/2017, 3:35 PM

## 2017-07-20 ENCOUNTER — Inpatient Hospital Stay (HOSPITAL_COMMUNITY): Payer: BLUE CROSS/BLUE SHIELD | Admitting: Physical Therapy

## 2017-07-20 ENCOUNTER — Inpatient Hospital Stay (HOSPITAL_COMMUNITY): Payer: BLUE CROSS/BLUE SHIELD | Admitting: *Deleted

## 2017-07-20 ENCOUNTER — Inpatient Hospital Stay (HOSPITAL_COMMUNITY): Payer: BLUE CROSS/BLUE SHIELD | Admitting: Occupational Therapy

## 2017-07-20 ENCOUNTER — Other Ambulatory Visit: Payer: Self-pay

## 2017-07-20 ENCOUNTER — Encounter (HOSPITAL_COMMUNITY): Payer: BLUE CROSS/BLUE SHIELD | Admitting: Psychology

## 2017-07-20 LAB — CBC
HCT: 32.7 % — ABNORMAL LOW (ref 39.0–52.0)
HEMOGLOBIN: 10.6 g/dL — AB (ref 13.0–17.0)
MCH: 29.7 pg (ref 26.0–34.0)
MCHC: 32.4 g/dL (ref 30.0–36.0)
MCV: 91.6 fL (ref 78.0–100.0)
PLATELETS: 321 10*3/uL (ref 150–400)
RBC: 3.57 MIL/uL — AB (ref 4.22–5.81)
RDW: 14.5 % (ref 11.5–15.5)
WBC: 6.1 10*3/uL (ref 4.0–10.5)

## 2017-07-20 NOTE — Progress Notes (Signed)
Physical Therapy Session Note  Patient Details  Name: Bradley Pearson MRN: [id number] Date of Birth: 05/21/1960  Today's Date: 07/20/2017 PT Individual Time: 1300-1415 PT Individual Time Calculation (min): 75 min   Short Term Goals: Week 1:  PT Short Term Goal 1 (Week 1): =LTGs due to ELOS  Skilled Therapeutic Interventions/Progress Updates:    Pt seated in w/c in room, agreeable to participate in therapy session. Pt reports some soreness in RLE, not rated. Manual w/c propulsion 500 ft (+) from inpatient rehab floor to elevator, outside across uneven surfaces and thresholds Mod I with use of BUE and occasional use of LLE. Pt is able to navigate environment with increased time and directional cues. Seated BLE therex x 15 reps: marches, LAQ, hip abd/add, HS curls, heel/toe raises with green theraband for LLE. Standing RLE therex with RW and SBA: marches, hip abd, hip ext, hip flex. Functional transfers in simulation apartment from low, pliable surfaces such as couch and recliner with RW and SBA. Pt is able to adhere to [facility] with transfers. Pt is able to navigate tight spaces with furniture obstacles in simulation apartment with RW and SBA. Pt left seated in w/c in room with needs in reach.  Therapy Documentation Precautions:  Precautions Precautions: Fall Restrictions Weight Bearing Restrictions: Yes RLE Weight Bearing: Touchdown weight bearing  See Function Navigator for Current Functional Status.   Therapy/Group: Individual Therapy  Peter Congoaylor Shaylynne Lunt, PT, DPT  07/20/2017, 3:33 PM
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
## 2017-07-20 NOTE — Progress Notes (Signed)
Social Work Patient ID: Bradley Pearson, male   DOB: [birth year], 57 y.o.   MRN: [id number]  Met with pt to discuss team conference goals mod/i-supervision level and target discharge date 4/12. He is going to sister's for a short time and then back to his home. Working on equipment and follow up.
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
## 2017-07-20 NOTE — Progress Notes (Signed)
Subjective/Complaints: Patient seen sitting up in his chair this morning. He states he slept much better last night by sleeping in the chair. He has questions about his short-term disability paperwork.  ROS: denies CP, SOB, N/V/D  Objective: Vital Signs: Blood pressure 137/86, pulse 79, temperature 98.8 F (37.1 C), temperature source Oral, resp. rate 18, SpO2 100 %. No results found. Results for orders placed or performed during the hospital encounter of 07/15/17 (from the past 72 hour(s))  CBC     Status: Abnormal   Collection Time: 07/20/17  6:10 AM  Result Value Ref Range   WBC 6.1 4.0 - 10.5 K/uL   RBC 3.57 (L) 4.22 - 5.81 MIL/uL   Hemoglobin 10.6 (L) 13.0 - 17.0 g/dL   HCT 24.432.7 (L) 01.039.0 - 27.252.0 %   MCV 91.6 78.0 - 100.0 fL   MCH 29.7 26.0 - 34.0 pg   MCHC 32.4 30.0 - 36.0 g/dL   RDW 53.614.5 64.411.5 - 03.415.5 %   Platelets 321 150 - 400 K/uL    Comment: Performed at [facility], [address] [place], [place] [address]     Gen NAD. Vital signs reviewed. HENT: Normocephalic, atraumatic Eyes: EOMI. No discharge.  Cardio: RRR and no JVD. Resp: CTA B/L and unlabored GI: BS positive and ND Musc/Skel:  No edema or tenderness in extremities Neuro: Alert  Motor: 5/5 in BUE 4+/5 LLE  3/5 HF, 3+/5 KE, 4/5 ADF RLE (stable) Skin:   Wound Right hip incision C/D/I  Assessment/Plan: 1. Functional deficits secondary to right subtrochanteric femur fx, multiple right rib fxs, C6 TVP fx,  TDWB RLE  which require 3+ hours per day of interdisciplinary therapy in a comprehensive inpatient rehab setting. Physiatrist is providing close team supervision and 24 hour management of active medical problems listed below. Physiatrist and rehab team continue to assess barriers to discharge/monitor patient progress toward functional and medical goals. FIM: Function - Bathing Position: Wheelchair/chair at sink Body parts bathed by patient: Right arm, Left arm, Chest, Abdomen, Front perineal area,  Buttocks, Right upper leg, Left upper leg Body parts bathed by helper: Right lower leg, Left lower leg, Back Assist Level: Touching or steadying assistance(Pt > 75%)  Function- Upper Body Dressing/Undressing What is the patient wearing?: Pull over shirt/dress Pull over shirt/dress - Perfomed by patient: Thread/unthread right sleeve, Thread/unthread left sleeve, Put head through opening, Pull shirt over trunk Assist Level: Supervision or verbal cues Function - Lower Body Dressing/Undressing What is the patient wearing?: Underwear, Pants, Non-skid slipper socks Position: Wheelchair/chair at sink Underwear - Performed by patient: Thread/unthread right underwear leg, Thread/unthread left underwear leg, Pull underwear up/down Pants- Performed by patient: Thread/unthread right pants leg, Thread/unthread left pants leg, Pull pants up/down Non-skid slipper socks- Performed by patient: Don/doff right sock, Don/doff left sock(sock aide) Non-skid slipper socks- Performed by helper: Don/doff right sock Assist for footwear: Partial/moderate assist Assist for lower body dressing: Supervision or verbal cues  Function - Toileting Toileting activity did not occur: No continent bowel/bladder event Toileting steps completed by patient: Adjust clothing prior to toileting, Performs perineal hygiene, Adjust clothing after toileting Toileting steps completed by helper: Adjust clothing prior to toileting, Adjust clothing after toileting Toileting Assistive Devices: Grab bar or rail Assist level: Supervision or verbal cues  Function - ArchivistToilet Transfers Toilet transfer assistive device: Grab bar, Walker Assist level to toilet: Supervision or verbal cues Assist level from toilet: Supervision or verbal cues  Function - Chair/bed transfer Chair/bed transfer method: Ambulatory Chair/bed  transfer assist level: Supervision or verbal cues Chair/bed transfer assistive device: Armrests, Walker Chair/bed transfer  details: Verbal cues for technique, Verbal cues for precautions/safety  Function - Locomotion: Wheelchair Will patient use wheelchair at discharge?: Yes Type: Manual Max wheelchair distance: >1000 Assist Level: No help, No cues, assistive device, takes more than reasonable amount of time Assist Level: No help, No cues, assistive device, takes more than reasonable amount of time Assist Level: No help, No cues, assistive device, takes more than reasonable amount of time Turns around,maneuvers to table,bed, and toilet,negotiates 3% grade,maneuvers on rugs and over doorsills: Yes Function - Locomotion: Ambulation Assistive device: Walker-rolling Max distance: 75 Assist level: Supervision or verbal cues Assist level: Supervision or verbal cues Walk 50 feet with 2 turns activity did not occur: Safety/medical concerns Assist level: Supervision or verbal cues Walk 150 feet activity did not occur: Safety/medical concerns Walk 10 feet on uneven surfaces activity did not occur: Safety/medical concerns  Function - Comprehension Comprehension: Auditory Comprehension assist level: Follows complex conversation/direction with no assist  Function - Expression Expression: Verbal Expression assist level: Expresses complex ideas: With no assist  Function - Social Interaction Social Interaction assist level: Interacts appropriately with others - No medications needed.  Function - Problem Solving Problem solving assist level: Solves complex problems: Recognizes & self-corrects  Function - Memory Memory assist level: Complete Independence: No helper Patient normally able to recall (first 3 days only): Current season, Location of own room, Staff names and faces, That he or she is in a hospital  Medical Problem List and Plan: 1.Functional and mobility deficitssecondary to right subtrochanteric femur fx, multiple right rib fxs, C6 TVP fx   TDWB RLE Cont CIR  2. DVT  Prophylaxis/Anticoagulation: Pharmaceutical:Lovenox, doppler 4/7 neg for DVT 3. Pain Management: Added robaxin to help manage muscle spasms RLE and lidocaine patches for persistent right chest wall pain. 4. Mood:LCSW to follow for evaluation and support. 5. Neuropsych: This patientiscapable of making decisions on hisown behalf. 6. Skin/Wound Care:  7. Fluids/Electrolytes/Nutrition:Monitor I/O.    BMET within acceptable range on 4/6 8. Right femur fractures/p ORIF: TDWB. 9. Right rib and manubrial fracture:Reports constant pain with some hypoxia with activity.    Follow up CXR with persistent right pneumothorax   Encourage IS.    Added lidocaine patch to right lateral ribs and to manubrium.  10 Urinary retention:UA neg Ucx NG.    Continue urecholine and Flomax.   Check PVR, not recorded 11.Constipation:  Decreased Miralax to qd 12.  Hypoalb- prostat 13. ABLA   Hb 10.6 on 4/10   Cont to monitor   LOS (Days) 5 A FACE TO FACE EVALUATION WAS PERFORMED  Virl Coble Karis Juba 07/20/2017, 8:16 AM

## 2017-07-20 NOTE — Progress Notes (Signed)
Occupational Therapy Session Note  Patient Details  Name: Bradley Pearson M Armentor MRN: [id number] Date of Birth: 1961-02-11  Today's Date: 07/20/2017 OT Individual Time: 1432-1530 OT Individual Time Calculation (min): 58 min    Short Term Goals: Week 1:  OT Short Term Goal 1 (Week 1): n/a d/t ELOS  Skilled Therapeutic Interventions/Progress Updates:    Pt completed shower and dressing to start session.  He utilized the wheelchair for gathering clothing prior to shower with modified independence.  He then completed bathing sit to stand with supervision from the shower seat.  He then completed dressing sit to stand at the sink from the wheelchair with supervision as well.  Reacher used for threading the RLE through the pants leg.  Sockaide was also utilized for donning both socks as well.  Once dressing was completed, he was able to use the walker to propel himself back into the bathroom to gather up towels and dirty clothing with supervision.  Finished session with practice using simulated walk-in shower and step over transfer with dimensions given for his sisters shower at discharge.  He was able to complete stepping in posteriorly and out anteriorly with supervision.  Pt left in wheelchair with call button in reach and safety belt in place.     Therapy Documentation Precautions:  Precautions Precautions: Fall Restrictions Weight Bearing Restrictions: Yes RLE Weight Bearing: Touchdown weight bearing   Pain: Pain Assessment Pain Scale: Faces Pain Score: 0-No pain Pain Type: Acute pain Pain Descriptors / Indicators: Discomfort Pain Onset: On-going Pain Intervention(s): Repositioned Multiple Pain Sites: No ADL: See Function Navigator for Current Functional Status.   Therapy/Group: Individual Therapy  Candice Tobey OTR/L 07/20/2017, 3:51 PM

## 2017-07-20 NOTE — Progress Notes (Signed)
Physical Therapy Session Note  Patient Details  Name: Bradley Pearson M Cortopassi MRN: [id number] Date of Birth: [birth year]  Today's Date: 07/20/2017 PT Individual Time: 0800-0900 PT Individual Time Calculation (min): 60 min   Short Term Goals: Week 1:  PT Short Term Goal 1 (Week 1): =LTGs due to ELOS  Skilled Therapeutic Interventions/Progress Updates: Pt received seated in w/c, c/o "soreness" in RLE and requests pain medication; RN alerted. Dons shoes with setupA to retrieve reacher, then able to retrieve and don shoes. W/c propulsion BUE x150' modI. Transfer w/c <>mat table modI. Supine>prone with S to attempt R hip flexor stretch; reports pain from laying on chest, attempted to prop on elbows with some relief but ultimately requesting to cease activity d/t discomfort. Returned to sitting with S to take medications. Supine hip flexor stretch off EOB x3 min; educated pt on purpose of stretch to counter prolonged hip flexion with sitting and minimal WB with activity. Discussed purpose of upcoming recreational therapy and neuropsychology evaluations. Sidelying RLE clamshell 2x10 reps. Gait x20', 2x75' with RW and S. Ascent/descent 8 steps 3" height with B handrails, min cues for technique. Performed nustep x8 min level 6 with average 35 steps/min for aerobic endurance and strengthening. Discussed home setup and plan to ambulate only at sisters house d/t furniture setup and small distance from one room to another. Also discussed follow up therapy and educated pt regarding benefits/requirements for HH vs OP; pt reports unable to have transportation during the week in an accessible vehicle, he will only be able to get in/out of his brothers sedan until he is able to practice in/out of SUV/truck, therefore plan for HHPT initially with d/c to OP as WB precautions lifted and pt able to access other vehicles. Stand pivot transfer with RW modI to recliner; remained seated in w/c all needs in reach. Reviewed recommendation  to have staff present for transfers, not cleared for modI in room at this time, pt reports understanding.      Therapy Documentation Precautions:  Precautions Precautions: Fall Restrictions Weight Bearing Restrictions: Yes RLE Weight Bearing: Touchdown weight bearing   See Function Navigator for Current Functional Status.   Therapy/Group: Individual Therapy  Vista Lawmanlizabeth J Tygielski 07/20/2017, 9:15 AM

## 2017-07-21 ENCOUNTER — Inpatient Hospital Stay (HOSPITAL_COMMUNITY): Payer: BLUE CROSS/BLUE SHIELD | Admitting: Occupational Therapy

## 2017-07-21 ENCOUNTER — Inpatient Hospital Stay (HOSPITAL_COMMUNITY): Payer: BLUE CROSS/BLUE SHIELD | Admitting: Physical Therapy

## 2017-07-21 DIAGNOSIS — K5901 Slow transit constipation: Secondary | ICD-10-CM

## 2017-07-21 NOTE — Progress Notes (Signed)
Occupational Therapy Discharge Summary  Patient Details  Name: Bradley Pearson MRN: [id number] Date of Birth: [birth year]  Today's Date: 07/21/2017 OT Individual Time: 1450-1532 OT Individual Time Calculation (min): 42 min    Session Note:  Pt completed functional mobility to and from the ADL kitchen with use of the RW and modified independence.  Once in the kitchen, pt was educated on simple meal prep with use of the RW.  He was able to then return demonstrate safe maneuver of items using the counter tops and flat surfaces.  He reports having access to a walker tray as well which will be beneficial for cooking and heating up microwave meals.  Finished session with return to the room and pt left in the wheelchair per request with call button and phone in reach.  Pt also made modified independent in room with RW as well.    Patient has met 9 of 9 long term goals due to improved activity tolerance, improved balance, ability to compensate for deficits and improved attention.  Patient to discharge at overall Modified Independent level.  Patient's care partner is independent to provide the necessary physical assistance at discharge.    Reasons goals not met: NA  Recommendation:  Patient will benefit from ongoing skilled OT services in home health setting to continue to advance functional skills in the area of BADL and Reduce care partner burden.  Feel pt will benefit from [facility] eval for safety and continuation of IADL and ADL independence.    Equipment: tub bench  Reasons for discharge: treatment goals met and discharge from hospital  Patient/family agrees with progress made and goals achieved: Yes  OT Discharge Precautions/Restrictions  Precautions Precautions: Fall Restrictions Weight Bearing Restrictions: Yes RLE Weight Bearing: Touchdown weight bearing  Pain Pain Assessment Pain Scale: 0-10 Pain Score: 5  Pain Type: Acute pain;Surgical pain Pain Location: Hip Pain Orientation:  Right Pain Descriptors / Indicators: Aching;Discomfort Pain Onset: With Activity Pain Intervention(s): Repositioned Multiple Pain Sites: No ADL  See Function Section of chart for details  Vision Baseline Vision/History: Wears glasses Wears Glasses: At all times Patient Visual Report: No change from baseline Vision Assessment?: No apparent visual deficits Perception  Perception: Within Functional Limits Praxis Praxis: Intact Cognition Overall Cognitive Status: Within Functional Limits for tasks assessed Arousal/Alertness: Awake/alert Orientation Level: Oriented X4 Memory: Impaired Memory Impairment: Decreased short term memory;Decreased recall of new information Decreased Short Term Memory: Verbal complex Awareness: Appears intact Problem Solving: Appears intact Safety/Judgment: Appears intact Comments: Pt does need increased time to process new techniques and instructions with regards to walker usage and transfers.  Sensation Sensation Light Touch: Appears Intact Stereognosis: Appears Intact Hot/Cold: Appears Intact Proprioception: Appears Intact Coordination Gross Motor Movements are Fluid and Coordinated: Yes Fine Motor Movements are Fluid and Coordinated: Yes Heel Shin Test: impaired d/t pain, decr ROM Motor  Motor Motor: Within Functional Limits Mobility  Bed Mobility Bed Mobility: Supine to Sit;Sit to Supine Supine to Sit: 6: Modified independent (Device/Increase time);HOB flat Sit to Supine: 6: Modified independent (Device/Increase time);HOB flat Transfers Sit to Stand: 6: Modified independent (Device/Increase time);With armrests;With upper extremity assist Stand to Sit: 6: Modified independent (Device/Increase time);With armrests  Trunk/Postural Assessment  Cervical Assessment Cervical Assessment: Within Functional Limits Thoracic Assessment Thoracic Assessment: Within Functional Limits Lumbar Assessment Lumbar Assessment: Within Functional  Limits Postural Control Postural Control: Within Functional Limits  Balance Balance Balance Assessed: Yes Dynamic Sitting Balance Dynamic Sitting - Balance Support: During functional activity;Feet supported;No upper extremity supported Dynamic  Sitting - Level of Assistance: 6: Modified independent (Device/Increase time) Static Standing Balance Static Standing - Balance Support: During functional activity;Bilateral upper extremity supported Static Standing - Level of Assistance: 6: Modified independent (Device/Increase time) Dynamic Standing Balance Dynamic Standing - Balance Support: During functional activity Dynamic Standing - Level of Assistance: 6: Modified independent (Device/Increase time) Extremity/Trunk Assessment RUE Assessment RUE Assessment: Within Functional Limits LUE Assessment LUE Assessment: Within Functional Limits   See Function Navigator for Current Functional Status.  Egan Sahlin OTR/L 07/21/2017, 5:26 PM

## 2017-07-21 NOTE — Progress Notes (Signed)
Occupational Therapy Session Note  Patient Details  Name: Bradley Pearson MRN: [id number] Date of Birth: [birth year]  Today's Date: 07/21/2017 OT Individual Time: 1000-1056 OT Individual Time Calculation (min): 56 min    Short Term Goals: Week 1:  OT Short Term Goal 1 (Week 1): n/a d/t ELOS  Skilled Therapeutic Interventions/Progress Updates:    Pt completed bathing and dressing sit to stand during session.  Pt used the RW for support to transport items around the room and to the shower with modified independence.  He was able to complete all bathing and dressing sit to stand with modified independence as well.  Reacher used for picking up items off of the floor with use of the RW as well as for threading the RLE with clothing.  Sockaide also used for donning the right sock as well.  Finished session with practice of tub shower transfer with use of the tub bench and supervision.  Pt will have access to a walk-in shower to start at his sister's house, but will have a tub shower when he moves back to his house.  Pt left in bed at end of session with call button and phone in reach.    Therapy Documentation Precautions:  Precautions Precautions: Fall Restrictions Weight Bearing Restrictions: Yes RLE Weight Bearing: Touchdown weight bearing   Pain: Pain Assessment Pain Scale: 0-10 Pain Score: 4  Faces Pain Scale: Hurts a little bit Pain Type: Acute pain Pain Location: Generalized Pain Orientation: Right Pain Descriptors / Indicators: Aching;Discomfort Pain Onset: With Activity Pain Intervention(s): Medication (See eMAR);Repositioned 2nd Pain Site Pain Location: Leg Pain Orientation: Right Pain Descriptors / Indicators: Discomfort Pain Onset: On-going Pain Intervention(s): Repositioned;Emotional support ADL: See Function Navigator for Current Functional Status.   Therapy/Group: Individual Therapy  Mariadelaluz Guggenheim OTR/L 07/21/2017, 12:33 PM

## 2017-07-21 NOTE — Progress Notes (Signed)
Social Work  Discharge Note  The overall goal for the admission was met for:   Discharge location: Yes-HOME TO SISTER'S AND BROTHER IN-LAW'S-ALMOST 24 HR SUPERVISION  Length of Stay: Yes-7 DAYS  Discharge activity level: Yes-MOD/I LEVEL  Home/community participation: Yes  Services provided included: MD, RD, PT, OT, RN, CM, TR, Pharmacy and SW  Financial Services: Private Insurance: BCBS & MED-PAY  Follow-up services arranged: Home Health: [facility] and Patient/Family has no preference for HH/DME agencies  Comments (or additional information):PT DID VERY WELL AND REACHED MOD/I GOING TO [place]. HAS ALL DME FROM [facility]. STD PAPERWORK GIVEN BACK TO PT AFTER FAXED IN.  Patient/Family verbalized understanding of follow-up arrangements: Yes  Individual responsible for coordination of the follow-up plan: SELF & CHERYL-SISTER  Confirmed correct DME delivered: Elease Hashimoto 07/21/2017    Gisele Pack, Gardiner Rhyme

## 2017-07-21 NOTE — Progress Notes (Signed)
Physical Therapy Discharge Summary  Patient Details  Name: Bradley Pearson MRN: [id number] Date of Birth: 07/10/1960  Today's Date: 07/21/2017 PT Individual Time: 1300-1400 and 0830-0900 PT Individual Time Calculation (min): 60 min and 30 min (total 90 min)    Patient has met 7 of 7 long term goals due to improved activity tolerance, improved balance, improved postural control, increased strength, increased range of motion, decreased pain and ability to compensate for deficits.  Patient to discharge at an ambulatory level Modified Independent; recommend min guard for stairs if needed to perform in community, pt to use ramp for home entry.   Patient's care partner is independent to provide the necessary physical assistance at discharge.  Reasons goals not met: All goals met  Recommendation:  Patient will benefit from ongoing skilled PT services in home health setting to continue to advance safe functional mobility, address ongoing impairments in strength, ROM, balance, activity tolerance, and minimize fall risk.  Equipment: W/c, RW  Reasons for discharge: treatment goals met and discharge from hospital  Patient/family agrees with progress made and goals achieved: Yes  PT Discharge Precautions/Restrictions Precautions Precautions: Fall Restrictions Weight Bearing Restrictions: Yes RLE Weight Bearing: Touchdown weight bearing Vital Signs Therapy Vitals Temp: 98.3 F (36.8 C) Temp Source: Oral Pulse Rate: (!) 106 Resp: 18 BP: 137/74 Patient Position (if appropriate): Sitting Oxygen Therapy SpO2: 100 % O2 Device: Room Air Pain Pain Assessment Pain Scale: 0-10 Pain Score: 5  Faces Pain Scale: Hurts a little bit Pain Type: Acute pain Pain Location: Generalized Pain Orientation: Right Pain Descriptors / Indicators: Aching;Discomfort Pain Onset: With Activity Pain Intervention(s): Medication (See eMAR) 2nd Pain Site Pain Location: Leg Pain Orientation: Right Pain  Descriptors / Indicators: Discomfort Pain Onset: On-going Pain Intervention(s): Repositioned;Emotional support Vision/Perception  Perception Perception: Within Functional Limits Praxis Praxis: Intact  Cognition Overall Cognitive Status: Within Functional Limits for tasks assessed Arousal/Alertness: Awake/alert Orientation Level: Oriented X4 Memory: Impaired Memory Impairment: Decreased short term memory;Decreased recall of new information Decreased Short Term Memory: Verbal complex Awareness: Appears intact Problem Solving: Appears intact Safety/Judgment: Appears intact Sensation Sensation Light Touch: Appears Intact Proprioception: Appears Intact Coordination Gross Motor Movements are Fluid and Coordinated: No Heel Shin Test: impaired d/t pain, decr ROM Motor  Motor Motor: Within Functional Limits  Mobility Bed Mobility Bed Mobility: Supine to Sit;Sit to Supine Supine to Sit: 6: Modified independent (Device/Increase time);HOB flat Sit to Supine: 6: Modified independent (Device/Increase time);HOB flat Transfers Transfers: Yes Sit to Stand: 6: Modified independent (Device/Increase time);With armrests;With upper extremity assist Stand to Sit: 6: Modified independent (Device/Increase time);With armrests Stand Pivot Transfers: 6: Modified independent (Device/Increase time);With armrests Locomotion  Ambulation Ambulation/Gait Assistance: 6: Modified independent (Device/Increase time) Ambulation Distance (Feet): 100 Feet Assistive device: Rolling walker Gait Gait: Yes Gait Pattern: Impaired Gait Pattern: Step-to pattern;Poor foot clearance - left Gait velocity: significantly decreased Stairs / Additional Locomotion Stairs: Yes Stairs Assistance: 4: Min guard Stair Management Technique: Two rails;Step to pattern;Forwards Number of Stairs: 12 Height of Stairs: 3 Ramp: 6: Modified independent Water quality scientist) Architect: Yes Wheelchair Assistance:  6: Modified independent (Device/Increase time) Environmental health practitioner: Both upper extremities Wheelchair Parts Management: Independent Distance: 150'  Trunk/Postural Assessment  Cervical Assessment Cervical Assessment: Within Functional Limits Thoracic Assessment Thoracic Assessment: Within Functional Limits Lumbar Assessment Lumbar Assessment: Within Functional Limits Postural Control Postural Control: Within Functional Limits  Balance Balance Balance Assessed: Yes Dynamic Sitting Balance Dynamic Sitting - Balance Support: During functional activity;Feet supported;No upper extremity supported Dynamic Sitting -  Level of Assistance: 6: Modified independent (Device/Increase time) Dynamic Standing Balance Dynamic Standing - Balance Support: No upper extremity supported;During functional activity Dynamic Standing - Level of Assistance: 6: Modified independent (Device/Increase time) Extremity Assessment  RUE Assessment RUE Assessment: Within Functional Limits LUE Assessment LUE Assessment: Within Functional Limits RLE Assessment RLE Assessment: Exceptions to WFL(hip flexion limited d/t 95 degrees, knee flexion limited to 107 degrees , knee extension -10 degrees) LLE Assessment LLE Assessment: Within Functional Limits  Skilled Therapeutic Intervention: Tx 1: Pt received seated in bed, c/o pain as above and agreeable to treatment. Bed mobility, transfer bed>w/c with RW, and w/c propulsion modI. Performed car transfer ambulatory with RW and modI to simulate home environment. Gait on ramp with modI, unlevel surface with S and RW. Returned to room modI w/c propulsion. Transfer to toilet with modI. Remained seated on toilet with instruction to use call bell when finished; pt agreeable.   tx 2: Pt received seated in w/c, denies pain and agreeable to treatment. Assessed remaining mobility as above with modI overall, min guard for stairs with B handrails. Reviewed LE sitting/standing HEP, and  discussed plan for progression with HH PT. W/c mobility in simulated community outing, including w/c propulsion on uneven surfaces and transfers to standard chairs with S. Reviewed recommendations for follow up therapy, use of w/c for community. Pt with no further concerns regarding d/c at this time. Remained seated in w/c at end of session, all needs in reach.    See Function Navigator for Current Functional Status.  Benjiman Core Tygielski 07/21/2017, 2:23 PM

## 2017-07-21 NOTE — Progress Notes (Signed)
Recreational Therapy Session Note  Patient Details  Name: Bradley Pearson MRN: [id number] Date of Birth: July 13, 1960 Today's Date: 07/21/2017  Pain: no c/o Skilled Therapeutic Interventions/Progress Updates: Session focused on community reintegration w/c level with supervision during co-treat with PT.  Pt performed stand pivot transfers while adhering to TDWB status RLE with supervision.  Pt ambulated (hopped with NWB through RLE) ~20 feet with RW.  Education provided on obstacle negotiation including elevator access.  Also discussed discharge planning and follow up therapies.  Therapy/Group: Co-Treatment  Krisann Mckenna 07/21/2017, 12:41 PM

## 2017-07-21 NOTE — Progress Notes (Signed)
Recreational Therapy Discharge Summary Patient Details  Name: Bradley Pearson MRN: [id number] Date of Birth: Aug 30, 1960 Today's Date: 07/21/2017   Comments on progress toward goals: TR sessions focused on leisure assessment, activity analysis with potential modifications, lifestyle changes in regards to smoking, and community reintegration.  Pt participated in simulated community mobility tasks w/c level & short distance ambulatory level with supervision.  Goals met. Reasons for discharge: discharge from hospital Patient/family agrees with progress made and goals achieved: Yes  [name] 07/21/2017, 12:43 PM

## 2017-07-21 NOTE — Progress Notes (Signed)
Subjective/Complaints: Pt seen sitting up in bed this morning. He states he was told about the plan for discharge tomorrow and he is really happy to hear it.  ROS: denies CP, SOB, N/V/D  Objective: Vital Signs: Blood pressure 133/80, pulse 75, temperature 98.2 F (36.8 C), temperature source Oral, resp. rate 18, SpO2 98 %. No results found. Results for orders placed or performed during the hospital encounter of 07/15/17 (from the past 72 hour(s))  CBC     Status: Abnormal   Collection Time: 07/20/17  6:10 AM  Result Value Ref Range   WBC 6.1 4.0 - 10.5 K/uL   RBC 3.57 (L) 4.22 - 5.81 MIL/uL   Hemoglobin 10.6 (L) 13.0 - 17.0 g/dL   HCT 40.932.7 (L) 81.139.0 - 91.452.0 %   MCV 91.6 78.0 - 100.0 fL   MCH 29.7 26.0 - 34.0 pg   MCHC 32.4 30.0 - 36.0 g/dL   RDW 78.214.5 95.611.5 - 21.315.5 %   Platelets 321 150 - 400 K/uL    Comment: Performed at [facility], [address] [place], [place] [address]     Gen NAD. Vital signs reviewed. HENT: Normocephalic, atraumatic Eyes: EOMI. No discharge.  Cardio: RRR and no JVD. Resp: CTA B/L and unlabored GI: BS positive and ND Musc/Skel:  No edema or tenderness in extremities Neuro: Alert  Motor: 5/5 in BUE 4+/5 LLE  3+/5 HF, 3+-4-/5 KE, 4/5 ADF RLE  Skin:   Wound Right hip incision C/D/I  Assessment/Plan: 1. Functional deficits secondary to right subtrochanteric femur fx, multiple right rib fxs, C6 TVP fx,  TDWB RLE  which require 3+ hours per day of interdisciplinary therapy in a comprehensive inpatient rehab setting. Physiatrist is providing close team supervision and 24 hour management of active medical problems listed below. Physiatrist and rehab team continue to assess barriers to discharge/monitor patient progress toward functional and medical goals. FIM: Function - Bathing Position: Shower Body parts bathed by patient: Right arm, Left arm, Chest, Abdomen, Front perineal area, Buttocks, Right upper leg, Left upper leg, Right lower leg, Left  lower leg Body parts bathed by helper: Right lower leg, Left lower leg, Back Bathing not applicable: Back Assist Level: Supervision or verbal cues  Function- Upper Body Dressing/Undressing What is the patient wearing?: Pull over shirt/dress Pull over shirt/dress - Perfomed by patient: Thread/unthread right sleeve, Thread/unthread left sleeve, Put head through opening, Pull shirt over trunk Assist Level: Supervision or verbal cues Function - Lower Body Dressing/Undressing What is the patient wearing?: Underwear, Pants, Non-skid slipper socks Position: Wheelchair/chair at sink Underwear - Performed by patient: Thread/unthread right underwear leg, Thread/unthread left underwear leg, Pull underwear up/down Pants- Performed by patient: Thread/unthread right pants leg, Thread/unthread left pants leg, Pull pants up/down Non-skid slipper socks- Performed by patient: Don/doff right sock, Don/doff left sock Non-skid slipper socks- Performed by helper: Don/doff right sock Assist for footwear: Partial/moderate assist Assist for lower body dressing: Supervision or verbal cues  Function - Toileting Toileting activity did not occur: No continent bowel/bladder event Toileting steps completed by patient: Adjust clothing prior to toileting, Performs perineal hygiene, Adjust clothing after toileting Toileting steps completed by helper: Adjust clothing prior to toileting, Adjust clothing after toileting Toileting Assistive Devices: Grab bar or rail Assist level: Supervision or verbal cues  Function - ArchivistToilet Transfers Toilet transfer assistive device: Grab bar, Walker Assist level to toilet: Supervision or verbal cues Assist level from toilet: Supervision or verbal cues  Function - Chair/bed transfer Chair/bed transfer method:  Stand pivot Chair/bed transfer assist level: Supervision or verbal cues Chair/bed transfer assistive device: Walker, Armrests Chair/bed transfer details: Verbal cues for  technique, Verbal cues for precautions/safety  Function - Locomotion: Wheelchair Will patient use wheelchair at discharge?: Yes Type: Manual Max wheelchair distance: 500 ft Assist Level: No help, No cues, assistive device, takes more than reasonable amount of time Assist Level: No help, No cues, assistive device, takes more than reasonable amount of time Assist Level: No help, No cues, assistive device, takes more than reasonable amount of time Turns around,maneuvers to table,bed, and toilet,negotiates 3% grade,maneuvers on rugs and over doorsills: Yes Function - Locomotion: Ambulation Assistive device: Walker-rolling Max distance: 75 Assist level: Supervision or verbal cues Assist level: Supervision or verbal cues Walk 50 feet with 2 turns activity did not occur: Safety/medical concerns Assist level: Supervision or verbal cues Walk 150 feet activity did not occur: Safety/medical concerns Walk 10 feet on uneven surfaces activity did not occur: Safety/medical concerns  Function - Comprehension Comprehension: Auditory Comprehension assist level: Follows complex conversation/direction with no assist  Function - Expression Expression: Verbal Expression assist level: Expresses complex ideas: With no assist  Function - Social Interaction Social Interaction assist level: Interacts appropriately with others - No medications needed.  Function - Problem Solving Problem solving assist level: Solves complex problems: Recognizes & self-corrects  Function - Memory Memory assist level: Complete Independence: No helper Patient normally able to recall (first 3 days only): Current season, Location of own room, Staff names and faces, That he or she is in a hospital  Medical Problem List and Plan: 1.Functional and mobility deficitssecondary to right subtrochanteric femur fx, multiple right rib fxs, C6 TVP fx   TDWB RLE Cont CIR, plan for DC tomorrow 2. DVT  Prophylaxis/Anticoagulation: Pharmaceutical:Lovenox, doppler 4/7 neg for DVT 3. Pain Management: Added robaxin to help manage muscle spasms RLE and lidocaine patches for persistent right chest wall pain. 4. Mood:LCSW to follow for evaluation and support. 5. Neuropsych: This patientiscapable of making decisions on hisown behalf. 6. Skin/Wound Care:  7. Fluids/Electrolytes/Nutrition:Monitor I/O.    BMET within acceptable range on 4/6 8. Right femur fractures/p ORIF: TDWB. 9. Right rib and manubrial fracture:Reports constant pain with some hypoxia with activity.    Follow up CXR with persistent right pneumothorax   Encourage IS.    Added lidocaine patch to right lateral ribs and to manubrium.  10 Urinary retention: resolved   UA neg Ucx NG.    Continue urecholine and Flomax.   PVRs unremarkable 11.Constipation:  Decreased Miralax to qd 12.  Hypoalb- prostat 13. ABLA   Hb 10.6 on 4/10   Cont to monitor   LOS (Days) 6 A FACE TO FACE EVALUATION WAS PERFORMED  Bradley Pearson 07/21/2017, 8:57 AM

## 2017-07-21 NOTE — Progress Notes (Signed)
Social Work Patient ID: Bradley Pearson, male   DOB: Aug 07, 1960, 57 y.o.   MRN: [id number]  Met with pt to discuss equipment he has all except the tub bench and he plans to get this on his own. He gave worker address to sister's home and have made referral to [facility] for follow up therapies. Set for discharge tomorrow. Gave him his STD paperwork after faxing into to company.

## 2017-07-22 MED ORDER — BETHANECHOL CHLORIDE 25 MG PO TABS: 25.0000 mg | ORAL_TABLET | Freq: Three times a day (TID) | ORAL | 0 refills | Status: AC

## 2017-07-22 MED ORDER — OXYCODONE HCL 5 MG PO TABS: 5.0000 mg | ORAL_TABLET | Freq: Four times a day (QID) | ORAL | 0 refills | Status: AC | PRN

## 2017-07-22 MED ORDER — POLYETHYLENE GLYCOL 3350 17 G PO PACK: 17.0000 g | PACK | Freq: Every day | ORAL | 0 refills | Status: AC

## 2017-07-22 MED ORDER — LIDOCAINE 5 % EX PTCH: 2.0000 | MEDICATED_PATCH | CUTANEOUS | 0 refills | Status: AC

## 2017-07-22 MED ORDER — ENOXAPARIN SODIUM 40 MG/0.4ML ~~LOC~~ SOLN: 40.0000 mg | SUBCUTANEOUS | 0 refills | Status: AC

## 2017-07-22 MED ORDER — TRAMADOL HCL 50 MG PO TABS: 50.0000 mg | ORAL_TABLET | Freq: Four times a day (QID) | ORAL | 0 refills | Status: AC | PRN

## 2017-07-22 MED ORDER — TAMSULOSIN HCL 0.4 MG PO CAPS: 0.4000 mg | ORAL_CAPSULE | Freq: Every day | ORAL | 0 refills | Status: AC

## 2017-07-22 MED ORDER — ACETAMINOPHEN 325 MG PO TABS: 650.0000 mg | ORAL_TABLET | Freq: Three times a day (TID) | ORAL | Status: AC

## 2017-07-22 MED ORDER — METHOCARBAMOL 500 MG PO TABS: 500.0000 mg | ORAL_TABLET | Freq: Four times a day (QID) | ORAL | 0 refills | Status: AC | PRN

## 2017-07-22 NOTE — Discharge Instructions (Signed)
Inpatient Rehab Discharge Instructions  Bradley Pearson Discharge date and time:  07/22/17  Activities/Precautions/ Functional Status: Activity: no lifting, driving, or strenuous exercise  till cleared by MD Diet: regular diet Wound Care: keep wound clean and dry  Contact Dr. Jena GaussHaddix if you develop any problems with your incision/wound--redness, swelling, increase in pain, drainage or if you develop fever or chills.   Functional status:  ___ No restrictions     ___ Walk up steps independently ___ 24/7 supervision/assistance   ___ Walk up steps with assistance _X__ Intermittent supervision/assistance  ___ Bathe/dress independently ___ Walk with walker     ___ Bathe/dress with assistance ___ Walk Independently    ___ Shower independently ___ Walk with assistance    _X__ Shower with assistance _X__ No alcohol     ___ Return to work/school ________   Special Instructions: 1. Touch down weight on right leg till cleared by MD.    COMMUNITY REFERRALS UPON DISCHARGE:    Home Health:   PT & OT    Agency:[facility] [phone]   Date of last service:07/22/2017  Medical Equipment/Items Ordered:HAS ALL NEEDED EQUIPMENT   My questions have been answered and I understand these instructions. I will adhere to these goals and the provided educational materials after my discharge from the hospital.  Patient/Caregiver Signature _______________________________ Date __________  Clinician Signature _______________________________________ Date __________  Please bring this form and your medication list with you to all your follow-up doctor's appointments.

## 2017-07-22 NOTE — Progress Notes (Signed)
Subjective/Complaints: Pt seen sitting up in bed this AM.  He slept well overnight.  He is ready for discharge.   ROS: Denies CP, SOB, N/V/D  Objective: Vital Signs: Blood pressure 132/78, pulse 79, temperature 98.4 F (36.9 C), temperature source Oral, resp. rate 19, SpO2 96 %. No results found. Results for orders placed or performed during the hospital encounter of 07/15/17 (from the past 72 hour(s))  CBC     Status: Abnormal   Collection Time: 07/20/17  6:10 AM  Result Value Ref Range   WBC 6.1 4.0 - 10.5 K/uL   RBC 3.57 (L) 4.22 - 5.81 MIL/uL   Hemoglobin 10.6 (L) 13.0 - 17.0 g/dL   HCT 16.132.7 (L) 09.639.0 - 04.552.0 %   MCV 91.6 78.0 - 100.0 fL   MCH 29.7 26.0 - 34.0 pg   MCHC 32.4 30.0 - 36.0 g/dL   RDW 40.914.5 81.111.5 - 91.415.5 %   Platelets 321 150 - 400 K/uL    Comment: Performed at [facility], [address] [place], [place] [address]     Gen NAD. Vital signs reviewed. HENT: Normocephalic, atraumatic Eyes: EOMI. No discharge.  Cardio: RRR and no JVD. Resp: CTA B/L and unlabored GI: BS positive and ND Musc/Skel:  No edema or tenderness in extremities Neuro: Alert  Motor: 5/5 in BUE 4+/5 LLE  4-/5 HF, 4/5 KE, 4+/5 ADF RLE  Skin:   Wound Right hip incision C/D/I  Assessment/Plan: 1. Functional deficits secondary to right subtrochanteric femur fx, multiple right rib fxs, C6 TVP fx,  TDWB RLE  which require 3+ hours per day of interdisciplinary therapy in a comprehensive inpatient rehab setting. Physiatrist is providing close team supervision and 24 hour management of active medical problems listed below. Physiatrist and rehab team continue to assess barriers to discharge/monitor patient progress toward functional and medical goals. FIM: Function - Bathing Position: Shower Body parts bathed by patient: Right arm, Left arm, Chest, Abdomen, Front perineal area, Buttocks, Right upper leg, Left upper leg, Right lower leg, Left lower leg, Back Body parts bathed by helper: Right  lower leg, Left lower leg, Back Bathing not applicable: Back Assist Level: More than reasonable time  Function- Upper Body Dressing/Undressing What is the patient wearing?: Pull over shirt/dress Pull over shirt/dress - Perfomed by patient: Thread/unthread right sleeve, Thread/unthread left sleeve, Put head through opening, Pull shirt over trunk Assist Level: More than reasonable time Function - Lower Body Dressing/Undressing What is the patient wearing?: Underwear, Pants, Non-skid slipper socks, Shoes Position: Wheelchair/chair at sink Underwear - Performed by patient: Thread/unthread right underwear leg, Thread/unthread left underwear leg, Pull underwear up/down Pants- Performed by patient: Thread/unthread right pants leg, Thread/unthread left pants leg, Pull pants up/down Non-skid slipper socks- Performed by patient: Don/doff right sock, Don/doff left sock Non-skid slipper socks- Performed by helper: Don/doff right sock Shoes - Performed by patient: Don/doff right shoe, Don/doff left shoe Assist for footwear: Partial/moderate assist Assist for lower body dressing: Supervision or verbal cues  Function - Toileting Toileting activity did not occur: No continent bowel/bladder event Toileting steps completed by patient: Adjust clothing prior to toileting, Performs perineal hygiene, Adjust clothing after toileting Toileting steps completed by helper: Adjust clothing prior to toileting, Adjust clothing after toileting Toileting Assistive Devices: Grab bar or rail Assist level: More than reasonable time  Function - ArchivistToilet Transfers Toilet transfer assistive device: Grab bar, Walker Assist level to toilet: No Help, no cues, assistive device, takes more than a reasonable amount of time  Assist level from toilet: No Help, no cues, assistive device, takes more than a reasonable amount of time  Function - Chair/bed transfer Chair/bed transfer method: Ambulatory Chair/bed transfer assist level:  No Help, no cues, assistive device, takes more than a reasonable amount of time Chair/bed transfer assistive device: Walker, Armrests Chair/bed transfer details: Verbal cues for technique, Verbal cues for precautions/safety  Function - Locomotion: Wheelchair Will patient use wheelchair at discharge?: Yes Type: Manual Max wheelchair distance: 500 Assist Level: No help, No cues, assistive device, takes more than reasonable amount of time Assist Level: No help, No cues, assistive device, takes more than reasonable amount of time Assist Level: No help, No cues, assistive device, takes more than reasonable amount of time Turns around,maneuvers to table,bed, and toilet,negotiates 3% grade,maneuvers on rugs and over doorsills: Yes Function - Locomotion: Ambulation Assistive device: Walker-rolling Max distance: 100 Assist level: No help, No cues, assistive device, takes more than a reasonable amount of time Assist level: No help, No cues, assistive device, takes more than a reasonable amount of time Walk 50 feet with 2 turns activity did not occur: Safety/medical concerns Assist level: No help, No cues, assistive device, takes more than a reasonable amount of time Walk 150 feet activity did not occur: Safety/medical concerns Assist level: No help, No cues, assistive device, takes more than a reasonable amount of time Walk 10 feet on uneven surfaces activity did not occur: Safety/medical concerns Assist level: Supervision or verbal cues  Function - Comprehension Comprehension: Auditory Comprehension assist level: Follows complex conversation/direction with no assist  Function - Expression Expression: Verbal Expression assist level: Expresses complex ideas: With no assist  Function - Social Interaction Social Interaction assist level: Interacts appropriately with others - No medications needed.  Function - Problem Solving Problem solving assist level: Solves complex problems: Recognizes &  self-corrects  Function - Memory Memory assist level: Complete Independence: No helper Patient normally able to recall (first 3 days only): Current season, Location of own room, Staff names and faces, That he or she is in a hospital  Medical Problem List and Plan: 1.Functional and mobility deficitssecondary to right subtrochanteric femur fx, multiple right rib fxs, C6 TVP fx   TDWB RLE D/c today  Will see patient for transitional care management in 1-2 weeks 2. DVT Prophylaxis/Anticoagulation: Pharmaceutical:Lovenox, doppler 4/7 neg for DVT 3. Pain Management: Added robaxin to help manage muscle spasms RLE and lidocaine patches for persistent right chest wall pain. 4. Mood:LCSW to follow for evaluation and support. 5. Neuropsych: This patientiscapable of making decisions on hisown behalf. 6. Skin/Wound Care:  7. Fluids/Electrolytes/Nutrition:Monitor I/O.    BMET within acceptable range on 4/6 8. Right femur fractures/p ORIF: TDWB. 9. Right rib and manubrial fracture:  Improving   Follow up CXR with persistent right pneumothorax   Encourage IS.    Added lidocaine patch to right lateral ribs and to manubrium.  10 Urinary retention: Improved   UA neg Ucx NG.    Continue urecholine and Flomax.   PVRs unremarkable 11.Constipation:  Decreased Miralax to qd 12.  Hypoalb- prostat 13. ABLA   Hb 10.6 on 4/10   Cont to monitor   LOS (Days) 7 A FACE TO FACE EVALUATION WAS PERFORMED  Taisa Deloria Karis Juba 07/22/2017, 8:58 AM

## 2017-07-22 NOTE — Progress Notes (Signed)
Patient discharged to sister's home, accompanied by his brother.
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
## 2017-07-26 ENCOUNTER — Telehealth: Payer: Self-pay | Admitting: Registered Nurse
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
## 2017-08-05 ENCOUNTER — Encounter: Payer: Self-pay | Admitting: Physical Medicine & Rehabilitation

## 2017-08-05 ENCOUNTER — Encounter
Payer: BLUE CROSS/BLUE SHIELD | Attending: Physical Medicine & Rehabilitation | Admitting: Physical Medicine & Rehabilitation

## 2017-08-05 VITALS — BP 125/90 | HR 97 | Resp 14 | Ht 71.0 in | Wt 216.0 lb

## 2017-08-05 DIAGNOSIS — T07XXXA Unspecified multiple injuries, initial encounter: Secondary | ICD-10-CM | POA: Diagnosis not present

## 2017-08-05 DIAGNOSIS — G8918 Other acute postprocedural pain: Secondary | ICD-10-CM

## 2017-08-05 DIAGNOSIS — X58XXXA Exposure to other specified factors, initial encounter: Secondary | ICD-10-CM | POA: Diagnosis not present

## 2017-08-05 DIAGNOSIS — K5901 Slow transit constipation: Secondary | ICD-10-CM

## 2017-08-05 DIAGNOSIS — S2241XA Multiple fractures of ribs, right side, initial encounter for closed fracture: Secondary | ICD-10-CM | POA: Diagnosis not present

## 2017-08-05 DIAGNOSIS — S2241XS Multiple fractures of ribs, right side, sequela: Secondary | ICD-10-CM

## 2017-08-05 DIAGNOSIS — F17210 Nicotine dependence, cigarettes, uncomplicated: Secondary | ICD-10-CM | POA: Insufficient documentation

## 2017-08-05 DIAGNOSIS — S2221XA Fracture of manubrium, initial encounter for closed fracture: Secondary | ICD-10-CM | POA: Diagnosis not present

## 2017-08-05 DIAGNOSIS — Z9889 Other specified postprocedural states: Secondary | ICD-10-CM | POA: Diagnosis not present

## 2017-08-05 DIAGNOSIS — K5900 Constipation, unspecified: Secondary | ICD-10-CM | POA: Insufficient documentation

## 2017-08-05 DIAGNOSIS — R269 Unspecified abnormalities of gait and mobility: Secondary | ICD-10-CM | POA: Insufficient documentation

## 2017-08-05 NOTE — Progress Notes (Signed)
Subjective:    Patient ID: Bradley Pearson, male    DOB: 1960/04/24, 57 y.o.   MRN: [id number]  HPI 57 year old male presents for transitional care management after receiving CIR for multi-ortho.   Admit date: 07/15/2017 Discharge date: 07/25/2017  At discharge, he was instructed to follow up with Ortho, which he did.  [facility]. Pain has improved.  He continues to use the walker.  Denies falls. He denies urinary symptoms. Urinary problems have resolved.  Bowel movements are improving.  Therapies: 1/week for PT DME: Previously possessed Mobility: Walker at all times  Pain Inventory Average Pain 4 Pain Right Now 4 My pain is intermittent, constant, sharp, dull, stabbing and aching  In the last 24 hours, has pain interfered with the following? General activity 0 Relation with others 0 Enjoyment of life 0 What TIME of day is your pain at its worst? varies Sleep (in general) Poor  Pain is worse with: unsure Pain improves with: medication Relief from Meds: 7  Mobility walk with assistance use a walker how many minutes can you walk? 30 ability to climb steps?  yes do you drive?  no transfers alone Do you have any goals in this area?  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 Congestive Heart Failure Mother   . Dementia Mother   . Lung cancer Father   . Prostate cancer Father   . Breast cancer Sister   . Prostate cancer Brother    Social History   Socioeconomic History  . Marital status: Unknown    Spouse name: Not on file  . Number of children: Not on file  . Years of education: Not on file  . Highest education level: Not on file  Occupational History  . Not on file  Social Needs  . Financial resource strain: Not on file  . Food  insecurity:    Worry: Not on file    Inability: Not on file  . Transportation needs:    Medical: Not on file    Non-medical: Not on file  Tobacco Use  . Smoking status: Current Every Day Smoker    Packs/day: 0.25    Years: 30.00    Pack years: 7.50    Types: Cigarettes  . Smokeless tobacco: Never Used  . Tobacco comment: uses low tar cigarettes  Substance and Sexual Activity  . 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 Drug use: Never  . Sexual activity: Not on file  Lifestyle  . Physical activity:    Days per week: Not on file    Minutes per session: Not on file  . Stress: Not on file  Relationships  . Social connections:    Talks on phone: Not on file    Gets together: Not on file    Attends religious service: Not on file    Active member of club or organization: Not on file    Attends meetings of clubs or organizations: Not on file    Relationship status: Not on file  Other Topics Concern  . Not on file  Social History Narrative  . Not on file   Past Surgical History:  Procedure Laterality Date  . ANKLE ARTHROPLASTY Right 10/09/2012  . ANKLE SURGERY Right  2014   pinning due to dislocated fracture  . FEMUR IM NAIL Right 07/11/2017  . FEMUR IM NAIL Right 07/11/2017   Procedure: INTRAMEDULLARY (IM) NAIL FEMORAL;  Surgeon: Roby Lofts, MD;  Location: [facility] OR;  Service: Orthopedics;  Laterality: Right;  . HUMERUS FRACTURE SURGERY Right    in 1980's   Past Medical History:  Diagnosis Date  . History of alcohol abuse    quit 11/2012   BP 125/90 (BP Location: Left Arm, Patient Position: Sitting, Cuff Size: Normal)   Pulse 97   Resp 14   Ht 5\' 11"  (1.803 m)   Wt 216 lb (98 kg)   SpO2 96%   BMI 30.13 kg/m   Opioid Risk Score:   Fall Risk Score:  `1  Depression screen PHQ 2/9  Depression screen PHQ 2/9 08/05/2017  Decreased Interest 1  Down, Depressed, Hopeless 0  PHQ - 2 Score 1  Altered sleeping 2  Tired, decreased  energy 1  Change in appetite 0  Feeling bad or failure about yourself  0  Trouble concentrating 1  Moving slowly or fidgety/restless 0  Suicidal thoughts 0  PHQ-9 Score 5  Difficult doing work/chores Not difficult at all    Review of Systems  Constitutional: Negative.   HENT: Negative.   Eyes: Negative.   Respiratory: Negative.   Cardiovascular: Negative.   Gastrointestinal: Negative.   Endocrine: Negative.   Genitourinary: Negative.   Musculoskeletal: Positive for gait problem.  Skin: Negative.   Allergic/Immunologic: Negative.   Hematological: Negative.   Psychiatric/Behavioral: Negative.   All other systems reviewed and are negative.      Objective:   Physical Exam Gen NAD. Vital signs reviewed. HENT: Normocephalic, atraumatic Eyes: EOMI. No discharge.  Cardio: RRR and no JVD. Resp: CTA B/L and unlabored GI: BS positive and ND Musc/Skel:  No edema or tenderness in extremities Neuro: Alert  Motor: 5/5 in BUE 4+-5/5 LLE  4-4+/5 HF, 4/5 KE, 4+-5/5 ADF RLE  Skin:   Wound Right hip incision C/D/I    Assessment & Plan:  57 year old male presents for transitional care management after receiving CIR for multi-ortho.   1. Functional and mobility deficits secondary to right subtrochanteric femur fx, multiple right rib fxs, C6 TVP fx   Cont therapies  WBAT now  Cont to follow up with Ortho  2. Pain Management  Cont to wean Oxy, no refills, refilled by Ortho  Cont Lidoderm patches  3. Right rib and manubrial fracture:   Improving, majority of pain at present  Cont meds  4. Constipation  Cont meds  Slowly improving  5. Gait abnormality  Cont walker for safety  Cont therapies  Meds reviewed Referrals reviewed All questions answered

## 2017-10-06 ENCOUNTER — Encounter: Payer: BLUE CROSS/BLUE SHIELD | Admitting: Physical Medicine & Rehabilitation

## 2017-10-20 ENCOUNTER — Encounter (INDEPENDENT_AMBULATORY_CARE_PROVIDER_SITE_OTHER): Payer: Self-pay

## 2017-10-20 ENCOUNTER — Ambulatory Visit: Payer: BLUE CROSS/BLUE SHIELD | Attending: Student

## 2017-10-20 ENCOUNTER — Other Ambulatory Visit: Payer: Self-pay

## 2017-10-20 DIAGNOSIS — R262 Difficulty in walking, not elsewhere classified: Secondary | ICD-10-CM

## 2017-10-20 DIAGNOSIS — M25551 Pain in right hip: Secondary | ICD-10-CM | POA: Diagnosis present

## 2017-10-20 DIAGNOSIS — M6281 Muscle weakness (generalized): Secondary | ICD-10-CM

## 2017-10-20 DIAGNOSIS — M25561 Pain in right knee: Secondary | ICD-10-CM | POA: Diagnosis present

## 2017-10-20 DIAGNOSIS — M25661 Stiffness of right knee, not elsewhere classified: Secondary | ICD-10-CM | POA: Insufficient documentation

## 2017-10-20 DIAGNOSIS — G8929 Other chronic pain: Secondary | ICD-10-CM | POA: Insufficient documentation
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
## 2017-10-25 ENCOUNTER — Ambulatory Visit: Payer: BLUE CROSS/BLUE SHIELD | Admitting: Physical Therapy

## 2017-10-26 ENCOUNTER — Encounter: Payer: BLUE CROSS/BLUE SHIELD | Admitting: Physical Therapy

## 2017-10-31 ENCOUNTER — Ambulatory Visit: Payer: BLUE CROSS/BLUE SHIELD

## 2017-11-08 ENCOUNTER — Encounter: Payer: BLUE CROSS/BLUE SHIELD | Admitting: Physical Therapy
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
## 2022-02-17 ENCOUNTER — Emergency Department (HOSPITAL_COMMUNITY): Payer: BC Managed Care – PPO

## 2022-02-17 ENCOUNTER — Encounter (HOSPITAL_COMMUNITY): Payer: Self-pay | Admitting: Emergency Medicine

## 2022-02-17 ENCOUNTER — Emergency Department (HOSPITAL_COMMUNITY)
Admission: EM | Admit: 2022-02-17 | Discharge: 2022-02-18 | Disposition: A | Discharge status: Home / Self Care | Payer: BC Managed Care – PPO | Attending: Emergency Medicine | Admitting: Emergency Medicine

## 2022-02-17 DIAGNOSIS — I10 Essential (primary) hypertension: Secondary | ICD-10-CM | POA: Insufficient documentation

## 2022-02-17 DIAGNOSIS — I160 Hypertensive urgency: Secondary | ICD-10-CM | POA: Diagnosis not present

## 2022-02-17 DIAGNOSIS — Z79899 Other long term (current) drug therapy: Secondary | ICD-10-CM | POA: Insufficient documentation

## 2022-02-17 DIAGNOSIS — R079 Chest pain, unspecified: Secondary | ICD-10-CM | POA: Diagnosis present

## 2022-02-17 LAB — CBC
HCT: 45.8 % (ref 39.0–52.0)
Hemoglobin: 15.7 g/dL (ref 13.0–17.0)
MCH: 30.3 pg (ref 26.0–34.0)
MCHC: 34.3 g/dL (ref 30.0–36.0)
MCV: 88.2 fL (ref 80.0–100.0)
Platelets: 229 10*3/uL (ref 150–400)
RBC: 5.19 MIL/uL (ref 4.22–5.81)
RDW: 13.1 % (ref 11.5–15.5)
WBC: 6.8 10*3/uL (ref 4.0–10.5)
nRBC: 0 % (ref 0.0–0.2)

## 2022-02-17 LAB — TROPONIN I (HIGH SENSITIVITY)
Troponin I (High Sensitivity): 2 ng/L (ref <18)
Troponin I (High Sensitivity): 3 ng/L (ref <18)

## 2022-02-17 LAB — BRAIN NATRIURETIC PEPTIDE: B Natriuretic Peptide: 21.3 pg/mL (ref 0.0–100.0)

## 2022-02-17 LAB — BASIC METABOLIC PANEL
Anion gap: 8 (ref 5–15)
BUN: 19 mg/dL (ref 6–20)
CO2: 21 mmol/L — ABNORMAL LOW (ref 22–32)
Calcium: 8.9 mg/dL (ref 8.9–10.3)
Chloride: 113 mmol/L — ABNORMAL HIGH (ref 98–111)
Creatinine, Ser: 0.85 mg/dL (ref 0.61–1.24)
GFR, Estimated: >60 mL/min (ref >60)
Glucose, Bld: 125 mg/dL — ABNORMAL HIGH (ref 70–99)
Potassium: 4 mmol/L (ref 3.5–5.1)
Sodium: 142 mmol/L (ref 135–145)

## 2022-02-17 MED ORDER — AMLODIPINE BESYLATE 5 MG PO TABS
5.0000 mg | ORAL_TABLET | Freq: Once | ORAL | Status: AC
  Administered 2022-02-17: 5 mg via ORAL
  Filled 2022-02-17: qty 1

## 2022-02-17 MED ORDER — AMLODIPINE BESYLATE 5 MG PO TABS: 5.0000 mg | ORAL_TABLET | Freq: Every day | ORAL | 0 refills | Status: AC

## 2022-02-17 NOTE — ED Triage Notes (Signed)
Pt reports centralized CP with radiation to left arm that is worse with rest x5 days. Also c/o headache and productive cough.  Denies sob Pt reports high blood pressure x2 days.

## 2022-02-17 NOTE — ED Provider Notes (Signed)
[facility] DEPT Provider Note   CSN: [id number] Arrival date & time: 02/17/22  1421     History  Chief Complaint  Patient presents with   Chest Pain    KAVIN WEISENBERG is a 61 y.o. male.  61 year old male with no signet past medical history presenting for complaints of chest pain.  Patient admits to chest pain intermittently over the last 5 days located on the right chest wall without radiation.  Patient denies any nausea or diaphoresis.  Has a history of fevers, chills, coughing.  Denies lower extremity swelling or orthopnea.  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  Its his was above [id number] systolic.  States over the last 3 days has been going to [facility] to have his blood pressures read by the automatic cuff and he was above 200/111.  On arrival patient's blood pressure was 181/100 with a repeat of 216/115.  Patient states he does not follow with a primary care physician.  He does not take blood pressure medications at home.  He does not adhere to a low sodium diet.  He only drinks 1 cup of coffee a day, no other caffeine.  The history is provided by the patient. No language interpreter was used.  Chest Pain Associated symptoms: no abdominal pain, no back pain, no cough, no fever, no palpitations, no shortness of breath and no vomiting        Home Medications Prior to Admission medications   Medication Sig Start Date End Date Taking? Authorizing Provider  acetaminophen (TYLENOL) 500 MG tablet Take 500 mg by mouth every 6 (six) hours as needed for moderate pain.   Yes [provider]  amLODipine (NORVASC) 5 MG tablet Take 1 tablet (5 mg total) by mouth daily. 02/17/22 [id number] Yes Campbell Stall P, DO  ibuprofen (ADVIL) 200 MG tablet Take 200 mg by mouth every 6 (six) hours as needed for moderate pain.   Yes [provider]  Multiple Vitamin (MULTIVITAMIN  WITH MINERALS) TABS tablet Take 1 tablet by mouth daily.   Yes [provider]  acetaminophen (TYLENOL) 325 MG tablet Take 2 tablets (650 mg total) by mouth 4 (four) times daily -  with meals and at bedtime. Patient not taking: Reported on 02/17/2022 07/22/17   Love, Ivan Anchors, PA-C  bethanechol (URECHOLINE) 25 MG tablet Take 1 tablet (25 mg total) by mouth 3 (three) times daily. Patient not taking: Reported on 10/20/2017 07/22/17   Love, Ivan Anchors, PA-C  enoxaparin (LOVENOX) 40 MG/0.4ML injection Inject 0.4 mLs (40 mg total) into the skin daily. Patient not taking: Reported on 10/20/2017 07/22/17   Love, Ivan Anchors, PA-C  lidocaine (LIDODERM) 5 % Place 2 patches onto the skin daily. Apply to ribs and chest at 6 am and remove at 6 pm Patient not taking: Reported on 02/17/2022 07/23/17   Love, Ivan Anchors, PA-C  methocarbamol (ROBAXIN) 500 MG tablet Take 1 tablet (500 mg total) by mouth every 6 (six) hours as needed for muscle spasms. Patient not taking: Reported on 10/20/2017 07/22/17   Love, Ivan Anchors, PA-C  oxyCODONE (OXY IR/ROXICODONE) 5 MG immediate release tablet Take 1-2 tablets (5-10 mg total) by mouth every 6 (six) hours as needed for severe pain. Patient not taking: Reported on 02/17/2022 07/22/17   Love, Ivan Anchors, PA-C  polyethylene glycol [facility] / GLYCOLAX) packet Take 17 g by mouth daily. Patient not taking: Reported on  10/20/2017 07/23/17   Love, Evlyn Kanner, PA-C  tamsulosin (FLOMAX) 0.4 MG CAPS capsule Take 1 capsule (0.4 mg total) by mouth daily after supper. Patient not taking: Reported on 10/20/2017 07/22/17   Love, Evlyn Kanner, PA-C  traMADol (ULTRAM) 50 MG tablet Take 1 tablet (50 mg total) by mouth every 6 (six) hours as needed for moderate pain. Patient not taking: Reported on 02/17/2022 07/22/17   Jacquelynn Cree, PA-C      Allergies    Patient has no known allergies.    Review of Systems   Review of Systems  Constitutional:  Negative for chills and fever.  HENT:  Negative for ear pain  and sore throat.   Eyes:  Negative for pain and visual disturbance.  Respiratory:  Negative for cough and shortness of breath.   Cardiovascular:  Positive for chest pain. Negative for palpitations.  Gastrointestinal:  Negative for abdominal pain and vomiting.  Genitourinary:  Negative for dysuria and hematuria.  Musculoskeletal:  Negative for arthralgias and back pain.  Skin:  Negative for color change and rash.  Neurological:  Negative for seizures and syncope.  All other systems reviewed and are negative.   Physical Exam Updated Vital Signs BP (!) 153/107 (BP Location: Left Arm)   Pulse 70   Temp 97.9 F (36.6 C) (Oral)   Resp 18   Ht 5\' 11"  (1.803 m)   Wt 124.7 kg   SpO2 92%   BMI 38.35 kg/m  Physical Exam Vitals and nursing note reviewed.  Constitutional:      General: He is not in acute distress.    Appearance: He is well-developed.  HENT:     Head: Normocephalic and atraumatic.  Eyes:     Conjunctiva/sclera: Conjunctivae normal.  Cardiovascular:     Rate and Rhythm: Normal rate and regular rhythm.     Pulses:          Dorsalis pedis pulses are 2+ on the right side and 2+ on the left side.     Heart sounds: No murmur heard. Pulmonary:     Effort: Pulmonary effort is normal. No respiratory distress.     Breath sounds: Normal breath sounds.  Abdominal:     Palpations: Abdomen is soft.     Tenderness: There is no abdominal tenderness.  Musculoskeletal:        General: No swelling.     Cervical back: Neck supple.     Right lower leg: No edema.     Left lower leg: No edema.  Skin:    General: Skin is warm and dry.     Capillary Refill: Capillary refill takes less than 2 seconds.  Neurological:     Mental Status: He is alert.  Psychiatric:        Mood and Affect: Mood normal.     ED Results / Procedures / Treatments   Labs (all labs ordered are listed, but only abnormal results are displayed) Labs Reviewed  BASIC METABOLIC PANEL - Abnormal; Notable for  the following components:      Result Value   Chloride 113 (*)    CO2 21 (*)    Glucose, Bld 125 (*)    All other components within normal limits  CBC  BRAIN NATRIURETIC PEPTIDE  TROPONIN I (HIGH SENSITIVITY)  TROPONIN I (HIGH SENSITIVITY)    EKG EKG Interpretation  Date/Time:  Wednesday February 17 2022 14:38:58 EST Ventricular Rate:  81 PR Interval:  171 QRS Duration: 111 QT Interval:  380 QTC  Calculation: 442 R Axis:   82 Text Interpretation: Sinus rhythm Confirmed by Campbell Stall ([id number]) on [id number] 9:23:40 PM  Radiology DG Chest 2 View  Result Date: 02/17/2022 CLINICAL DATA:  Chest pain. EXAM: CHEST - 2 VIEW COMPARISON:  Chest x-ray July 16, 2017. FINDINGS: The heart size and mediastinal contours are within normal limits. Both lungs are clear. No visible pleural effusions or pneumothorax. No acute osseous abnormality. IMPRESSION: No active cardiopulmonary disease. Electronically Signed   By: Margaretha Sheffield M.D.   On: 02/17/2022 15:13    Procedures Procedures    Medications Ordered in ED Medications  amLODipine (NORVASC) tablet 5 mg (has no administration in time range)    ED Course/ Medical Decision Making/ A&P                           Medical Decision Making Risk Prescription drug management.   58:15 PM 61 year old male with no signet past medical history presenting for complaints of chest pain.  Is alert oriented x3, no acute distress, afebrile and hypertensive.  Blood pressure on arrival was 181/100.  Repeat drawling 216/115.  After several hours while waiting in the emergency department lobby his blood pressure is now down to 153/107.  I independently interpreted patient's labs and EKG. ECG demonstrates normal sinus rhythm.  No ST segment elevation or depression.  Stable troponins.  Stable electrolytes.  Stable renal function.  Concerns for hypertensive urgency without hypertensive emergency.  Patient started on Norvasc in the emergency department.   Prescription sent to pharmacy.  Patient recommended to establish care with primary care and given the wellness center at [facility] health if he is unable to get in in the next month recheck of blood pressure and further management of blood pressure medications.  Patient in no distress and overall condition improved here in the ED. Detailed discussions were had with the patient regarding current findings, and need for close f/u with PCP or on call doctor. The patient has been instructed to return immediately if the symptoms worsen in any way for re-evaluation. Patient verbalized understanding and is in agreement with current care plan. All questions answered prior to discharge.        Final Clinical Impression(s) / ED Diagnoses Final diagnoses:  Hypertensive urgency  Chest pain, unspecified type    Rx / DC Orders ED Discharge Orders          Ordered    amLODipine (NORVASC) 5 MG tablet  Daily        02/17/22 2211              Lianne Cure, DO [id number]

## 2022-02-17 NOTE — ED Provider Triage Note (Addendum)
Emergency Medicine Provider Triage Evaluation Note  Bradley Pearson , a 61 y.o. male  was evaluated in triage.  Pt complains of CP for the past few days, mostly in the morning and evenings (first wake up and relaxing in the evening before bed) x 5 days. 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 Had BP checked at home a few days ago, found to be elevated, went to pharmacy for the past few days for BP checks on machine. BP high today over 211/98. Called clinic but wasn't able to get seen so came to the ER.  Chest pressure currently, headache. Left upper arm discomfort. Occasional cough (productive), SHOB at times. Denies changes in vision, lower leg swelling.   Not on BP meds, no heart hx. Former smoker, quit 4 years ago.   BP 154/97  Review of Systems  Positive: As above Negative: As above   Physical Exam  BP (!) 181/100 (BP Location: Left Arm)   Pulse 81   Temp 98.5 F (36.9 C) (Oral)   Resp 14   Ht 5\' 11"  (1.803 m)   Wt 124.7 kg   SpO2 98%   BMI 38.35 kg/m  Gen:   Awake, no distress   Resp:  Normal effort  MSK:   Moves extremities without difficulty  Other:    Medical Decision Making  Medically screening exam initiated at 2:46 PM.  Appropriate orders placed.  Bradley Pearson was informed that the remainder of the evaluation will be completed by another provider, this initial triage assessment does not replace that evaluation, and the importance of remaining in the ED until their evaluation is complete.     Vinie Sill, PA-C 02/17/22 1449    13/08/23, PA-C 02/17/22 1449

## 2022-02-17 NOTE — Discharge Instructions (Addendum)
Blood pressure medication called Norvasc was sent to your pharmacy.  It is a 5 mg pill.  Please take this daily.    Please follow-up with the primary care physician or the wellness clinic provided you are unable to get in with a doctor in the next month blood pressure recheck and further management of blood pressure medications.

## 2023-04-16 ENCOUNTER — Emergency Department (HOSPITAL_COMMUNITY)
Admission: EM | Admit: 2023-04-16 | Discharge: 2023-04-16 | Disposition: A | Discharge status: Home / Self Care | Payer: Worker's Compensation | Attending: Emergency Medicine | Admitting: Emergency Medicine

## 2023-04-16 ENCOUNTER — Emergency Department (HOSPITAL_COMMUNITY): Payer: BC Managed Care – PPO

## 2023-04-16 ENCOUNTER — Encounter (HOSPITAL_COMMUNITY): Payer: Self-pay

## 2023-04-16 DIAGNOSIS — S92415A Nondisplaced fracture of proximal phalanx of left great toe, initial encounter for closed fracture: Secondary | ICD-10-CM | POA: Diagnosis not present

## 2023-04-16 DIAGNOSIS — Z79899 Other long term (current) drug therapy: Secondary | ICD-10-CM | POA: Insufficient documentation

## 2023-04-16 DIAGNOSIS — Y990 Civilian activity done for income or pay: Secondary | ICD-10-CM | POA: Insufficient documentation

## 2023-04-16 DIAGNOSIS — W19XXXA Unspecified fall, initial encounter: Secondary | ICD-10-CM | POA: Diagnosis not present

## 2023-04-16 DIAGNOSIS — S90932A Unspecified superficial injury of left great toe, initial encounter: Secondary | ICD-10-CM | POA: Diagnosis present

## 2023-04-16 MED ORDER — OXYCODONE-ACETAMINOPHEN 5-325 MG PO TABS
1.0000 | ORAL_TABLET | Freq: Once | ORAL | Status: AC
  Administered 2023-04-16: 1 via ORAL
  Filled 2023-04-16: qty 1

## 2023-04-16 MED ORDER — OXYCODONE-ACETAMINOPHEN 5-325 MG PO TABS: 1.0000 | ORAL_TABLET | Freq: Four times a day (QID) | ORAL | 0 refills | Status: AC | PRN

## 2023-04-16 NOTE — Discharge Instructions (Addendum)
 Keep elevated to reduce swelling. Wear the boot whenever up and walking. Use your cane to assist in balance.   Take oxycodone  for pain as prescribed. You can also take ibuprofen 600 mg (3 over-the-counter strength tablets) every 6 hours.   Call Dr. Layman office on Monday morning to schedule a time to be seen in office. He said either Monday or Wednesday.

## 2023-04-16 NOTE — ED Provider Notes (Signed)
 [facility] Pearson EMERGENCY DEPARTMENT AT [facility] Provider Note   CSN: [id number] Arrival date & time: 04/16/23  1545     History  Chief Complaint  Patient presents with   Foot Injury    Bradley Pearson is a 63 y.o. male.  Patient to ED with left foot injury while at work just prior to arrival. He was wearing boots when an object weighing an estimated 150 lbs fell onto the distal left foot. No other injury. Not anticoagulated.   The history is provided by the patient. No language interpreter was used.  Foot Injury      Home Medications Prior to Admission medications   Medication Sig Start Date End Date Taking? Authorizing Provider  oxyCODONE -acetaminophen  (PERCOCET/ROXICET) 5-325 MG tablet Take 1 tablet by mouth every 6 (six) hours as needed for severe pain (pain score 7-10). 04/16/23  Yes Odell Balls, PA-C  acetaminophen  (TYLENOL ) 325 MG tablet Take 2 tablets (650 mg total) by mouth 4 (four) times daily -  with meals and at bedtime. Patient not taking: Reported on 02/17/2022 07/22/17   Love, Sharlet RAMAN, PA-C  acetaminophen  (TYLENOL ) 500 MG tablet Take 500 mg by mouth every 6 (six) hours as needed for moderate pain.    [provider]  amLODipine  (NORVASC ) 5 MG tablet Take 1 tablet (5 mg total) by mouth daily. 02/17/22 03/19/22  Elnor Hila P, DO  bethanechol  (URECHOLINE ) 25 MG tablet Take 1 tablet (25 mg total) by mouth 3 (three) times daily. Patient not taking: Reported on 10/20/2017 07/22/17   Love, Sharlet RAMAN, PA-C  enoxaparin  (LOVENOX ) 40 MG/0.4ML injection Inject 0.4 mLs (40 mg total) into the skin daily. Patient not taking: Reported on 10/20/2017 07/22/17   Love, Sharlet RAMAN, PA-C  ibuprofen (ADVIL) 200 MG tablet Take 200 mg by mouth every 6 (six) hours as needed for moderate pain.    [provider]  lidocaine  (LIDODERM ) 5 % Place 2 patches onto the skin daily. Apply to ribs and chest at 6 am and remove at 6 pm Patient not taking: Reported on 02/17/2022  07/23/17   Love, Sharlet RAMAN, PA-C  methocarbamol  (ROBAXIN ) 500 MG tablet Take 1 tablet (500 mg total) by mouth every 6 (six) hours as needed for muscle spasms. Patient not taking: Reported on 10/20/2017 07/22/17   Love, Sharlet RAMAN, PA-C  Multiple Vitamin (MULTIVITAMIN WITH MINERALS) TABS tablet Take 1 tablet by mouth daily.    [provider]  oxyCODONE  (OXY IR/ROXICODONE ) 5 MG immediate release tablet Take 1-2 tablets (5-10 mg total) by mouth every 6 (six) hours as needed for severe pain. Patient not taking: Reported on 02/17/2022 07/22/17   Love, Sharlet RAMAN, PA-C  polyethylene glycol (MIRALAX  / GLYCOLAX ) packet Take 17 g by mouth daily. Patient not taking: Reported on 10/20/2017 07/23/17   Love, Sharlet RAMAN, PA-C  tamsulosin  (FLOMAX ) 0.4 MG CAPS capsule Take 1 capsule (0.4 mg total) by mouth daily after supper. Patient not taking: Reported on 10/20/2017 07/22/17   Love, Sharlet RAMAN, PA-C  traMADol  (ULTRAM ) 50 MG tablet Take 1 tablet (50 mg total) by mouth every 6 (six) hours as needed for moderate pain. Patient not taking: Reported on 02/17/2022 07/22/17   Maurice Sharlet RAMAN, PA-C      Allergies    Patient has no known allergies.    Review of Systems   Review of Systems  Physical Exam Updated Vital Signs BP (!) 187/99   Pulse 86   Temp 99.3 F (37.4 C) (Oral)  Resp 19   Ht 5' 11 (1.803 m)   Wt 124.7 kg   SpO2 97%   BMI 38.35 kg/m  Physical Exam Constitutional:      Appearance: He is well-developed.  Pulmonary:     Effort: Pulmonary effort is normal.  Musculoskeletal:        General: Normal range of motion.     Cervical back: Normal range of motion.     Comments: Left foot is markedly swollen over distal forefoot with ecchymotic changes. Superficial small abrasion over foot at the interphalangeal space of 1st and 2nd toe. No nail injury.   Skin:    General: Skin is warm and dry.  Neurological:     Mental Status: He is alert and oriented to person, place, and time.     ED Results /  Procedures / Treatments   Labs (all labs ordered are listed, but only abnormal results are displayed) Labs Reviewed - No data to display  EKG None  Radiology DG Foot Complete Left Result Date: 04/16/2023 CLINICAL DATA:  Pain after injury EXAM: LEFT FOOT - COMPLETE 3 VIEW COMPARISON:  None Available. FINDINGS: There is a comminuted fracture of the proximal phalanx of the great toe. Fracture lines extend to the metatarsophalangeal joint and distally to the interphalangeal joint. No additional fracture or dislocation seen. Preserved bone mineralization. Soft tissue swelling. Calcaneal spurs. IMPRESSION: Comminuted fracture involving the proximal phalanx of the great toe. Fracture line appears to extend to the adjacent metatarsophalangeal joint in the interphalangeal joint. Soft tissue swelling. Electronically Signed   By: Ranell Bring M.D.   On: 04/16/2023 16:42    Procedures Procedures    Medications Ordered in ED Medications  oxyCODONE -acetaminophen  (PERCOCET/ROXICET) 5-325 MG per tablet 1 tablet (1 tablet Oral Given 04/16/23 1754)    ED Course/ Medical Decision Making/ A&P Clinical Course as of 04/16/23 1826  Sat Apr 16, 2023  1751 Left foot injury involving comminuted fracture of the proximal phalanx of the great toe. Closed injury. Discussed with Dr. Celena. Ok for CAM boot. Can follow in office Monday or Wednesday next week (Jan 6 or 8). Pain addressed.  [SU]    Clinical Course User Index [SU] Odell Balls, PA-C                                 Medical Decision Making Amount and/or Complexity of Data Reviewed Radiology: ordered.  Risk Prescription drug management.           Final Clinical Impression(s) / ED Diagnoses Final diagnoses:  Closed nondisplaced fracture of proximal phalanx of left great toe, initial encounter    Rx / DC Orders ED Discharge Orders          Ordered    oxyCODONE -acetaminophen  (PERCOCET/ROXICET) 5-325 MG tablet  Every 6 hours PRN         04/16/23 1824              Odell Balls, PA-C 04/16/23 1826    Horton, Roxie HERO, DO 04/16/23 2238

## 2023-04-16 NOTE — Progress Notes (Signed)
 Orthopedic Tech Progress Note Patient Details:  Bradley Pearson December 06, 1960 [id number]  Ortho Devices Type of Ortho Device: CAM walker Ortho Device/Splint Location: LLE Ortho Device/Splint Interventions: Ordered, Application, Adjustment   Post Interventions Patient Tolerated: Well Instructions Provided: Adjustment of device, Care of device  [name] 04/16/2023, 5:57 PM

## 2023-04-16 NOTE — ED Triage Notes (Signed)
 Pt arrives via POV. Pt reports while at work he had roughly a 150 lb object fall on the top of his left foot. Obvious bruising and swelling noted. Pt does have a small wound as well. No bleeding at this time. Pt AxOx4. Pt was able to ambulate to triage.
